# Patient Record
Sex: Female | Born: 1980 | Race: White | Hispanic: No | Marital: Married | Smoking: Never smoker
Health system: Southern US, Community
[De-identification: ages and names within clinical notes are randomized; demographics above are authoritative.]

## PROBLEM LIST (undated history)

## (undated) ENCOUNTER — Emergency Department: Admission: EM | Payer: Managed Care, Other (non HMO)

## (undated) DIAGNOSIS — G43909 Migraine, unspecified, not intractable, without status migrainosus: Secondary | ICD-10-CM

## (undated) DIAGNOSIS — M1990 Unspecified osteoarthritis, unspecified site: Secondary | ICD-10-CM

## (undated) HISTORY — DX: Unspecified osteoarthritis, unspecified site: M19.90

## (undated) HISTORY — DX: Migraine, unspecified, not intractable, without status migrainosus: G43.909

---
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
## 2004-06-05 ENCOUNTER — Ambulatory Visit: Payer: Self-pay | Admitting: Family Medicine

## 2004-08-26 ENCOUNTER — Emergency Department: Payer: Self-pay | Admitting: Emergency Medicine

## 2006-05-01 ENCOUNTER — Ambulatory Visit: Payer: Self-pay | Admitting: Ophthalmology

## 2007-08-26 ENCOUNTER — Ambulatory Visit: Payer: Self-pay | Admitting: Internal Medicine

## 2007-08-26 DIAGNOSIS — J309 Allergic rhinitis, unspecified: Secondary | ICD-10-CM

## 2007-08-26 DIAGNOSIS — R002 Palpitations: Secondary | ICD-10-CM | POA: Insufficient documentation

## 2007-08-28 LAB — CONVERTED CEMR LAB
AST: 23 units/L (ref 0–37)
Albumin: 4.5 g/dL (ref 3.5–5.2)
Alkaline Phosphatase: 59 units/L (ref 39–117)
BUN: 11 mg/dL (ref 6–23)
Hemoglobin: 13.7 g/dL (ref 12.0–15.0)
MCHC: 33.2 g/dL (ref 30.0–36.0)
Potassium: 4 meq/L (ref 3.5–5.3)
RDW: 12.4 % (ref 11.5–15.5)

## 2009-05-17 ENCOUNTER — Ambulatory Visit: Payer: Self-pay | Admitting: General Practice

## 2009-05-27 ENCOUNTER — Ambulatory Visit: Payer: Self-pay | Admitting: Family Medicine

## 2011-01-08 ENCOUNTER — Ambulatory Visit: Payer: Self-pay | Admitting: Neurology

## 2011-05-23 ENCOUNTER — Emergency Department: Payer: Self-pay | Admitting: Emergency Medicine

## 2011-05-23 LAB — URINALYSIS, COMPLETE
Bilirubin,UR: NEGATIVE
Ph: 6 (ref 4.5–8.0)
Squamous Epithelial: <1
WBC UR: NONE SEEN /HPF (ref 0–5)

## 2011-05-23 LAB — COMPREHENSIVE METABOLIC PANEL
Creatinine: 0.69 mg/dL (ref 0.60–1.30)
EGFR (African American): >60
Glucose: 97 mg/dL (ref 65–99)
Osmolality: 279 (ref 275–301)
Potassium: 3.5 mmol/L (ref 3.5–5.1)
SGOT(AST): 26 U/L (ref 15–37)
SGPT (ALT): 25 U/L
Sodium: 140 mmol/L (ref 136–145)
Total Protein: 7.8 g/dL (ref 6.4–8.2)

## 2011-05-23 LAB — CBC
HCT: 37.1 % (ref 35.0–47.0)
HGB: 12.9 g/dL (ref 12.0–16.0)
MCH: 32 pg (ref 26.0–34.0)
Platelet: 259 10*3/uL (ref 150–440)
RBC: 4.02 10*6/uL (ref 3.80–5.20)
RDW: 12.3 % (ref 11.5–14.5)

## 2011-05-23 LAB — WET PREP, GENITAL

## 2011-05-23 LAB — HCG, QUANTITATIVE, PREGNANCY: Beta Hcg, Quant.: <1 m[IU]/mL — ABNORMAL LOW

## 2012-05-25 ENCOUNTER — Emergency Department: Payer: Self-pay | Admitting: Emergency Medicine

## 2012-05-25 LAB — COMPREHENSIVE METABOLIC PANEL
Anion Gap: 7 (ref 7–16)
BUN: 6 mg/dL — ABNORMAL LOW (ref 7–18)
Bilirubin,Total: 0.3 mg/dL (ref 0.2–1.0)
Co2: 23 mmol/L (ref 21–32)
Creatinine: 0.54 mg/dL — ABNORMAL LOW (ref 0.60–1.30)
EGFR (African American): >60
EGFR (Non-African Amer.): >60
Osmolality: 269 (ref 275–301)
SGOT(AST): 19 U/L (ref 15–37)
SGPT (ALT): 18 U/L (ref 12–78)
Sodium: 136 mmol/L (ref 136–145)

## 2012-05-25 LAB — URINALYSIS, COMPLETE
Glucose,UR: NEGATIVE mg/dL (ref 0–75)
Ketone: NEGATIVE
Ph: 6 (ref 4.5–8.0)
Specific Gravity: 1.009 (ref 1.003–1.030)

## 2012-05-25 LAB — CBC
HCT: 33.1 % — ABNORMAL LOW (ref 35.0–47.0)
HGB: 12 g/dL (ref 12.0–16.0)
MCH: 33.2 pg (ref 26.0–34.0)
Platelet: 223 10*3/uL (ref 150–440)
RDW: 13 % (ref 11.5–14.5)
WBC: 9.1 10*3/uL (ref 3.6–11.0)

## 2012-05-25 LAB — HCG, QUANTITATIVE, PREGNANCY: Beta Hcg, Quant.: 4255 m[IU]/mL — ABNORMAL HIGH

## 2012-10-24 ENCOUNTER — Observation Stay: Payer: Self-pay

## 2012-10-30 ENCOUNTER — Observation Stay: Payer: Self-pay | Admitting: Obstetrics and Gynecology

## 2012-11-01 ENCOUNTER — Inpatient Hospital Stay: Payer: Self-pay

## 2012-11-02 LAB — CBC WITH DIFFERENTIAL/PLATELET
Basophil #: 0 10*3/uL (ref 0.0–0.1)
Basophil %: 0.3 %
Eosinophil #: 0 10*3/uL (ref 0.0–0.7)
Lymphocyte %: 24.4 %
MCH: 33.6 pg (ref 26.0–34.0)
Monocyte #: 1.1 x10 3/mm — ABNORMAL HIGH (ref 0.2–0.9)
Neutrophil #: 8.5 10*3/uL — ABNORMAL HIGH (ref 1.4–6.5)
Neutrophil %: 66.6 %

## 2013-04-07 ENCOUNTER — Ambulatory Visit: Payer: Self-pay | Admitting: Family Medicine

## 2013-04-07 LAB — RAPID INFLUENZA A&B ANTIGENS

## 2014-05-31 NOTE — H&P (Signed)
L&D Evaluation:  History:  HPI 34 year old G1 P0 with EDC=10/27/2012 presents to L&D at 39 4/7 weeks for a AFI and NST due to S<D. Was seen in the office yesterday and FH had decreased from 36 to 34 cm. Has an appt 10/7 for an ultrasound for growth and AFI. Baby active.   Presents with size<dates for NST and AFI.   Patient's Medical History migraine   Patient's Surgical History none   Medications Pre Natal Vitamins   Allergies NKDA   Social History none   Family History Non-Contributory   ROS:  ROS see HPI   Exam:  Vital Signs stable   General no apparent distress   Mental Status clear    Abdomen gravid, non-tender   Estimated Fetal Weight 6#9oz per biometrics   Fetal Position cephalic   FHT Reactive NST with baseline 125 and accels to 160-170s   Ucx occ   Other AFI=10.7cm (1.56cm+2.41+4.16+2.57cm)   Impression:  Impression reactive NST, Normal AFI   Plan:  Plan DC home. Continue daily FKCs. FU on 10/7 as scheduled. LAbor precautions.   Electronic Signatures: Karene Fry (CNM)  (Signed 06-Oct-14 06:07)  Authored: L&D Evaluation   Last Updated: 06-Oct-14 06:07 by Karene Fry (CNM)

## 2014-05-31 NOTE — H&P (Signed)
L&D Evaluation:  History:  HPI 34 year old G1 P0 with EDC=10/27/2012 presents to L&D at 40 3/7 weeks.  Sent from clinic where post dates NST revealed decel to 90's lasting 1.83min.  AFI was 8cm.  +FM, no LOF, no VB.  1cm in clinic   Presents with size<dates for NST and AFI.   Patient's Medical History migraine   Patient's Surgical History none   Medications Pre Natal Vitamins   Allergies NKDA   Social History none   Family History Non-Contributory   ROS:  ROS see HPI   Exam:  Vital Signs stable   General no apparent distress   Mental Status clear   Abdomen gravid, non-tender   Fetal Position cephalic   FHT Category I NST   Ucx absent   Impression:  Impression reactive NST   Plan:  Plan EFM/NST   Comments 1) Tracing strictly category I over 2-hrs of monitoring, that in conjuction with normal AFI patient reassured and discharged home with routine labor precautions 2) IOL on 11/01/12   Electronic Signatures: Dorthula Nettles (MD)  (Signed 10-Oct-14 18:24)  Authored: L&D Evaluation   Last Updated: 10-Oct-14 18:24 by Dorthula Nettles (MD)

## 2014-06-28 ENCOUNTER — Telehealth: Payer: Self-pay | Admitting: Obstetrics and Gynecology

## 2014-06-28 NOTE — Telephone Encounter (Signed)
PT CALLED AND NEEDS REFILL ON BC BUT HAS SOME QUESTIONS FIRST. PLEASE CALL!!

## 2014-07-05 NOTE — Telephone Encounter (Signed)
Left message for pt to call.

## 2014-07-19 NOTE — Telephone Encounter (Signed)
Mailed card for pt to contact office

## 2014-12-04 ENCOUNTER — Emergency Department
Admission: EM | Admit: 2014-12-04 | Discharge: 2014-12-04 | Disposition: A | Discharge status: Home / Self Care | Payer: Worker's Compensation | Attending: Emergency Medicine | Admitting: Emergency Medicine

## 2014-12-04 ENCOUNTER — Encounter: Payer: Self-pay | Admitting: Emergency Medicine

## 2014-12-04 ENCOUNTER — Emergency Department: Payer: Worker's Compensation

## 2014-12-04 DIAGNOSIS — S67192A Crushing injury of right middle finger, initial encounter: Secondary | ICD-10-CM | POA: Insufficient documentation

## 2014-12-04 DIAGNOSIS — W231XXA Caught, crushed, jammed, or pinched between stationary objects, initial encounter: Secondary | ICD-10-CM | POA: Diagnosis not present

## 2014-12-04 DIAGNOSIS — Y9389 Activity, other specified: Secondary | ICD-10-CM | POA: Insufficient documentation

## 2014-12-04 DIAGNOSIS — S6721XA Crushing injury of right hand, initial encounter: Secondary | ICD-10-CM | POA: Diagnosis not present

## 2014-12-04 DIAGNOSIS — Y990 Civilian activity done for income or pay: Secondary | ICD-10-CM | POA: Diagnosis not present

## 2014-12-04 DIAGNOSIS — Y9289 Other specified places as the place of occurrence of the external cause: Secondary | ICD-10-CM | POA: Diagnosis not present

## 2014-12-04 DIAGNOSIS — S67190A Crushing injury of right index finger, initial encounter: Secondary | ICD-10-CM | POA: Diagnosis not present

## 2014-12-04 DIAGNOSIS — S6991XA Unspecified injury of right wrist, hand and finger(s), initial encounter: Secondary | ICD-10-CM | POA: Diagnosis present

## 2014-12-04 MED ORDER — OXYCODONE-ACETAMINOPHEN 5-325 MG PO TABS
1.0000 | ORAL_TABLET | Freq: Once | ORAL | Status: AC
  Administered 2014-12-04: 1 via ORAL
  Filled 2014-12-04: qty 1

## 2014-12-04 MED ORDER — OXYCODONE-ACETAMINOPHEN 5-325 MG PO TABS: 1.0000 | ORAL_TABLET | ORAL | Status: DC | PRN

## 2014-12-04 NOTE — Discharge Instructions (Signed)
Cryotherapy Cryotherapy is when you put ice on your injury. Ice helps lessen pain and puffiness (swelling) after an injury. Ice works the best when you start using it in the first 24 to 48 hours after an injury. HOME CARE  Put a dry or damp towel between the ice pack and your skin.  You may press gently on the ice pack.  Leave the ice on for no more than 10 to 20 minutes at a time.  Check your skin after 5 minutes to make sure your skin is okay.  Rest at least 20 minutes between ice pack uses.  Stop using ice when your skin loses feeling (numbness).  Do not use ice on someone who cannot tell you when it hurts. This includes small children and people with memory problems (dementia). GET HELP RIGHT AWAY IF:  You have white spots on your skin.  Your skin turns blue or pale.  Your skin feels waxy or hard.  Your puffiness gets worse. MAKE SURE YOU:   Understand these instructions.  Will watch your condition.  Will get help right away if you are not doing well or get worse.   This information is not intended to replace advice given to you by your health care provider. Make sure you discuss any questions you have with your health care provider.   Document Released: 06/26/2007 Document Revised: 04/01/2011 Document Reviewed: 08/30/2010 Elsevier Interactive Patient Education 2016 Stella with orthopedist if any continued problems with her fingers. Percocet as needed for pain. Ice and elevate to decrease swelling and controlled pain. Return to the emergency room if any severe worsening of her symptoms.

## 2014-12-04 NOTE — ED Notes (Signed)
States she shut her right hand in door at work  Pain is mainly to right 3 finger

## 2014-12-04 NOTE — ED Provider Notes (Signed)
[facility] Emergency Department Provider Note  ____________________________________________  Time seen: Approximately 1:06 PM  I have reviewed the triage vital signs and the nursing notes.   HISTORY  Chief Complaint Hand Pain  HPI Diana Whitaker is a 34 y.o. female is here with complaint of right index and third finger pain. Patient states that her hand was caught in the ambulance door. She denies any cuts or abrasions but states that there is swelling to the distal tips of her fingers. She is not taking any over-the-counter medication for this. This is workman's comp. Currently she rates her pain as 5 out of 10.  Supervisor is in the room with the patient and states that he is not concerned about drug screen at this time while he is filling out some paperwork. Prior to x-ray results supervisor left the room and it was unclear whether or not patient is supposed to have a drug screen at this time.   History reviewed. No pertinent past medical history.  Patient Active Problem List   Diagnosis Date Noted  . ALLERGIC RHINITIS 08/26/2007  . PALPITATIONS 08/26/2007    History reviewed. No pertinent past surgical history.  Current Outpatient Rx  Name  Route  Sig  Dispense  Refill  . oxyCODONE-acetaminophen (PERCOCET) 5-325 MG tablet   Oral   Take 1 tablet by mouth every 4 (four) hours as needed for severe pain.   20 tablet   0     Allergies Review of patient's allergies indicates no known allergies.  No family history on file.  Social History Social History  Substance Use Topics  . Smoking status: Never Smoker   . Smokeless tobacco: None  . Alcohol Use: No    Review of Systems Constitutional: No fever/chills ENT: No trauma Cardiovascular: Denies chest pain. Respiratory: Denies shortness of breath. Gastrointestinal:   No nausea, no vomiting.   Musculoskeletal: Negative for back pain.  Positive right hand pain Skin: Negative for  rash. Neurological: Negative for headaches, focal weakness or numbness.  10-point ROS otherwise negative.  ____________________________________________   PHYSICAL EXAM:  VITAL SIGNS: ED Triage Vitals  Enc Vitals Group     BP 12/04/14 1218 124/68 mmHg     Pulse Rate 12/04/14 1218 75     Resp 12/04/14 1218 16     Temp 12/04/14 1218 97.8 F (36.6 C)     Temp Source 12/04/14 1218 Oral     SpO2 12/04/14 1218 98 %     Weight 12/04/14 1218 140 lb (63.504 kg)     Height 12/04/14 1218 5\' 4"  (1.626 m)     Head Cir --      Peak Flow --      Pain Score 12/04/14 1220 5     Pain Loc --      Pain Edu? --      Excl. in [place]? --     Constitutional: Alert and oriented. Well appearing and in no acute distress. Eyes: Conjunctivae are normal. PERRL. EOMI. Head: Atraumatic. Nose: No congestion/rhinnorhea. Neck: No stridor.   Cardiovascular: Normal rate, regular rhythm. Grossly normal heart sounds.  Good peripheral circulation. Respiratory: Normal respiratory effort.  No retractions. Lungs CTAB. Gastrointestinal: Soft and nontender. No distention. Musculoskeletal: Right hand no gross deformity. There is moderate tenderness with decreased motion right index and middle finger. There is some soft tissue swelling present. Motor sensory function intact. Movements of the right hand is slow and guarded secondary to pain. Neurologic:  Normal speech and  language. No gross focal neurologic deficits are appreciated. No gait instability. Skin:  Skin is warm, dry and intact. No rash noted. No ecchymosis or abrasions were noted. Psychiatric: Mood and affect are normal. Speech and behavior are normal.  ____________________________________________   LABS (all labs ordered are listed, but only abnormal results are displayed)  Labs Reviewed - No data to display  RADIOLOGY  X-ray the right hand per radiologist and reviewed by me shows no evidence of fracture dislocation. I, Johnn Hai, personally  viewed and evaluated these images (plain radiographs) as part of my medical decision making.  ____________________________________________   PROCEDURES  Procedure(s) performed: None  Critical Care performed: No  ____________________________________________   INITIAL IMPRESSION / ASSESSMENT AND PLAN / ED COURSE  Pertinent labs & imaging results that were available during my care of the patient were reviewed by me and considered in my medical decision making (see chart for details).  Percocet was given to the patient while in the emergency room. Right hand was wrapped for protection and finger splint for the third finger. Patient was made aware that there was no fracture but more of a crush type injury. She is ice and elevate her hand to reduce swelling and pain. She is follow-up with orthopedist if any continued problems. ____________________________________________   FINAL CLINICAL IMPRESSION(S) / ED DIAGNOSES  Final diagnoses:  Crushing injury of right hand and finger, initial encounter      Johnn Hai, PA-C 12/04/14 [place], MD 12/04/14 [phone]

## 2014-12-04 NOTE — ED Notes (Addendum)
Pt states right fingers were caught in ambulance door. Pt presents with pain to right index and middle fingers. Pt demonstrates movement in fingers with the exception of tip of middle finger. Injury occurred at work.

## 2015-04-06 ENCOUNTER — Encounter: Payer: Self-pay | Admitting: Physician Assistant

## 2015-04-06 ENCOUNTER — Ambulatory Visit: Payer: Self-pay | Admitting: Physician Assistant

## 2015-04-06 VITALS — BP 110/65 | HR 75 | Temp 97.8°F

## 2015-04-06 DIAGNOSIS — H1031 Unspecified acute conjunctivitis, right eye: Secondary | ICD-10-CM

## 2015-04-06 MED ORDER — TOBRAMYCIN 0.3 % OP SOLN
2.0000 [drp] | Freq: Four times a day (QID) | OPHTHALMIC | Status: AC
Start: 2015-04-06 — End: 2015-04-11

## 2015-04-06 NOTE — Progress Notes (Signed)
S: c/o r eye being red and irritated, son has pink eye, eye was not matted this am, used one of her sons drops this am  O: vitals wnl, nad, perrl eomi conjunctive red, throat wnl, neck supple no lymph, lungs c t a, cv rrr  A: acute conjunctivitis  P: tobramycin opth gtts, no work tomorrow if eye is still red/draining

## 2015-05-19 ENCOUNTER — Other Ambulatory Visit: Payer: Self-pay | Admitting: *Deleted

## 2015-05-19 ENCOUNTER — Telehealth: Payer: Self-pay | Admitting: *Deleted

## 2015-05-19 MED ORDER — ACYCLOVIR 400 MG PO TABS: 400.0000 mg | ORAL_TABLET | Freq: Two times a day (BID) | ORAL | Status: DC

## 2015-05-19 NOTE — Telephone Encounter (Signed)
Done-ac 

## 2015-05-19 NOTE — Telephone Encounter (Signed)
Patient called na states she needs a refill of her Acyclovir. Patient states her RX expired and she is coming to see Melody on 07/20/15 for a annual . Patient was wondering if she can get a Rx before she see her. Her pharmacy is [facility] in [place]. Thanks.

## 2015-06-16 ENCOUNTER — Encounter: Payer: Self-pay | Admitting: Physician Assistant

## 2015-06-16 ENCOUNTER — Encounter (INDEPENDENT_AMBULATORY_CARE_PROVIDER_SITE_OTHER): Payer: Self-pay

## 2015-06-16 ENCOUNTER — Ambulatory Visit: Payer: Self-pay | Admitting: Physician Assistant

## 2015-06-16 VITALS — BP 100/60 | HR 69 | Temp 98.3°F | Ht 64.0 in | Wt 150.0 lb

## 2015-06-16 DIAGNOSIS — Z0000 Encounter for general adult medical examination without abnormal findings: Secondary | ICD-10-CM

## 2015-06-16 MED ORDER — RIZATRIPTAN BENZOATE 10 MG PO TABS: 10.0000 mg | ORAL_TABLET | ORAL | Status: DC | PRN

## 2015-06-16 NOTE — Progress Notes (Signed)
   Subjective:Physical Exam    Patient ID: Diana Whitaker, female    DOB: [birth year], 35 y.o.   MRN: [id number]  HPI Patient for annual physical exam. Medication refill for Maxalt   Review of Systems Seasonal Rhinitis Migraine Headaches    Objective:   Physical Exam No acute distress. Vital signs WNL. HEENT for bilateral edematous nasal turbinates with clear rhinorrhea. Neck supple, without adenopathy. Lungs CTA, and Heart RRR. Abdomen w/o HSM, normoactive BS, SNTTP. No spinal deformity, F/E ROM. No deformity of upper/lower extremities. F/E ROM. Strength 5/5. CNII-XII grossly intact. Labs pending.       Assessment & Plan:Well exam   Refill Maxalt . Follow up with PCP/GYN  For continual care.

## 2015-06-16 NOTE — Addendum Note (Signed)
Addended by: Rudene Anda T on: 06/16/2015 10:16 AM   Modules accepted: Orders

## 2015-06-17 LAB — CMP12+LP+TP+TSH+6AC+CBC/D/PLT
ALBUMIN: 4.1 g/dL (ref 3.5–5.5)
ALK PHOS: 68 IU/L (ref 39–117)
ALT: 13 IU/L (ref 0–32)
AST: 12 IU/L (ref 0–40)
Albumin/Globulin Ratio: 1.5 (ref 1.2–2.2)
BASOS ABS: 0 10*3/uL (ref 0.0–0.2)
BASOS: 0 %
BUN / CREAT RATIO: 13 (ref 9–23)
BUN: 11 mg/dL (ref 6–20)
Bilirubin Total: 0.3 mg/dL (ref 0.0–1.2)
CALCIUM: 8.7 mg/dL (ref 8.7–10.2)
CHOL/HDL RATIO: 3.3 ratio (ref 0.0–4.4)
CREATININE: 0.84 mg/dL (ref 0.57–1.00)
Chloride: 103 mmol/L (ref 96–106)
Cholesterol, Total: 110 mg/dL (ref 100–199)
EOS (ABSOLUTE): 0.2 10*3/uL (ref 0.0–0.4)
EOS: 2 %
Free Thyroxine Index: 2.2 (ref 1.2–4.9)
GFR, EST AFRICAN AMERICAN: 103 mL/min/{1.73_m2} (ref >59)
GFR, EST NON AFRICAN AMERICAN: 89 mL/min/{1.73_m2} (ref >59)
GGT: 12 IU/L (ref 0–60)
GLOBULIN, TOTAL: 2.7 g/dL (ref 1.5–4.5)
Glucose: 85 mg/dL (ref 65–99)
HDL: 33 mg/dL — AB (ref >39)
HEMATOCRIT: 40 % (ref 34.0–46.6)
HEMOGLOBIN: 13.4 g/dL (ref 11.1–15.9)
IMMATURE GRANULOCYTES: 0 %
IRON: 113 ug/dL (ref 27–159)
Immature Grans (Abs): 0 10*3/uL (ref 0.0–0.1)
LDH: 118 IU/L — AB (ref 119–226)
LDL CALC: 62 mg/dL (ref 0–99)
LYMPHS ABS: 2.6 10*3/uL (ref 0.7–3.1)
Lymphs: 35 %
MCH: 30.9 pg (ref 26.6–33.0)
MCHC: 33.5 g/dL (ref 31.5–35.7)
MCV: 92 fL (ref 79–97)
MONOCYTES: 8 %
Monocytes Absolute: 0.6 10*3/uL (ref 0.1–0.9)
Neutrophils Absolute: 4.1 10*3/uL (ref 1.4–7.0)
Neutrophils: 55 %
PHOSPHORUS: 3.7 mg/dL (ref 2.5–4.5)
PLATELETS: 333 10*3/uL (ref 150–379)
Potassium: 4.2 mmol/L (ref 3.5–5.2)
RBC: 4.33 x10E6/uL (ref 3.77–5.28)
RDW: 12.5 % (ref 12.3–15.4)
SODIUM: 140 mmol/L (ref 134–144)
T3 UPTAKE RATIO: 22 % — AB (ref 24–39)
T4, Total: 9.9 ug/dL (ref 4.5–12.0)
TSH: 1.87 u[IU]/mL (ref 0.450–4.500)
Total Protein: 6.8 g/dL (ref 6.0–8.5)
Triglycerides: 76 mg/dL (ref 0–149)
URIC ACID: 5.4 mg/dL (ref 2.5–7.1)
VLDL CHOLESTEROL CAL: 15 mg/dL (ref 5–40)
WBC: 7.5 10*3/uL (ref 3.4–10.8)

## 2015-06-17 LAB — VITAMIN D 25 HYDROXY (VIT D DEFICIENCY, FRACTURES): VIT D 25 HYDROXY: 41.6 ng/mL (ref 30.0–100.0)

## 2015-07-17 ENCOUNTER — Other Ambulatory Visit: Payer: Self-pay | Admitting: *Deleted

## 2015-07-17 ENCOUNTER — Telehealth: Payer: Self-pay | Admitting: *Deleted

## 2015-07-17 MED ORDER — RIZATRIPTAN BENZOATE 10 MG PO TABS: 10.0000 mg | ORAL_TABLET | ORAL | Status: DC | PRN

## 2015-07-17 NOTE — Telephone Encounter (Signed)
Patient called and stated she needed to R/S her appt with Melody on 07/20/15 to 08/30/15. Patient needs a refill of her Maxalt to last her until her next appt which is  08/30/15. Her pharmacy [facility] in [place] .  Thanks

## 2015-07-17 NOTE — Telephone Encounter (Signed)
Done-ac 

## 2015-07-20 ENCOUNTER — Encounter: Payer: Self-pay | Admitting: Obstetrics and Gynecology

## 2015-08-30 ENCOUNTER — Encounter: Payer: Self-pay | Admitting: Obstetrics and Gynecology

## 2015-08-30 ENCOUNTER — Ambulatory Visit (INDEPENDENT_AMBULATORY_CARE_PROVIDER_SITE_OTHER): Payer: Managed Care, Other (non HMO) | Admitting: Obstetrics and Gynecology

## 2015-08-30 ENCOUNTER — Other Ambulatory Visit: Payer: Self-pay | Admitting: Obstetrics and Gynecology

## 2015-08-30 VITALS — BP 106/70 | HR 88 | Ht 64.0 in | Wt 152.9 lb

## 2015-08-30 DIAGNOSIS — Z01419 Encounter for gynecological examination (general) (routine) without abnormal findings: Secondary | ICD-10-CM | POA: Diagnosis not present

## 2015-08-30 DIAGNOSIS — E663 Overweight: Secondary | ICD-10-CM | POA: Insufficient documentation

## 2015-08-30 DIAGNOSIS — G43839 Menstrual migraine, intractable, without status migrainosus: Secondary | ICD-10-CM | POA: Insufficient documentation

## 2015-08-30 DIAGNOSIS — K5900 Constipation, unspecified: Secondary | ICD-10-CM | POA: Diagnosis not present

## 2015-08-30 DIAGNOSIS — K5909 Other constipation: Secondary | ICD-10-CM

## 2015-08-30 DIAGNOSIS — N943 Premenstrual tension syndrome: Secondary | ICD-10-CM

## 2015-08-30 DIAGNOSIS — G43909 Migraine, unspecified, not intractable, without status migrainosus: Secondary | ICD-10-CM | POA: Insufficient documentation

## 2015-08-30 MED ORDER — LINACLOTIDE 145 MCG PO CAPS: 145.0000 ug | ORAL_CAPSULE | Freq: Every day | ORAL | 6 refills | Status: DC

## 2015-08-30 MED ORDER — LEVONORGEST-ETH ESTRAD 91-DAY 0.15-0.03 &0.01 MG PO TABS: 1.0000 | ORAL_TABLET | Freq: Every day | ORAL | 4 refills | Status: DC

## 2015-08-30 MED ORDER — RIZATRIPTAN BENZOATE 10 MG PO TABS: 10.0000 mg | ORAL_TABLET | ORAL | 2 refills | Status: DC | PRN

## 2015-08-30 NOTE — Patient Instructions (Signed)
Preventive Care for Adults, Female A healthy lifestyle and preventive care can promote health and wellness. Preventive health guidelines for women include the following key practices.  A routine yearly physical is a good way to check with your health care provider about your health and preventive screening. It is a chance to share any concerns and updates on your health and to receive a thorough exam.  Visit your dentist for a routine exam and preventive care every 6 months. Brush your teeth twice a day and floss once a day. Good oral hygiene prevents tooth decay and gum disease.  The frequency of eye exams is based on your age, health, family medical history, use of contact lenses, and other factors. Follow your health care provider's recommendations for frequency of eye exams.  Eat a healthy diet. Foods like vegetables, fruits, whole grains, low-fat dairy products, and lean protein foods contain the nutrients you need without too many calories. Decrease your intake of foods high in solid fats, added sugars, and salt. Eat the right amount of calories for you.Get information about a proper diet from your health care provider, if necessary.  Regular physical exercise is one of the most important things you can do for your health. Most adults should get at least 150 minutes of moderate-intensity exercise (any activity that increases your heart rate and causes you to sweat) each week. In addition, most adults need muscle-strengthening exercises on 2 or more days a week.  Maintain a healthy weight. The body mass index (BMI) is a screening tool to identify possible weight problems. It provides an estimate of body fat based on height and weight. Your health care provider can find your BMI and can help you achieve or maintain a healthy weight.For adults 20 years and older:  A BMI below 18.5 is considered underweight.  A BMI of 18.5 to 24.9 is normal.  A BMI of 25 to 29.9 is considered  overweight.  A BMI of 30 and above is considered obese.  Maintain normal blood lipids and cholesterol levels by exercising and minimizing your intake of saturated fat. Eat a balanced diet with plenty of fruit and vegetables. Blood tests for lipids and cholesterol should begin at age 64 and be repeated every 5 years. If your lipid or cholesterol levels are high, you are over 50, or you are at high risk for heart disease, you may need your cholesterol levels checked more frequently.Ongoing high lipid and cholesterol levels should be treated with medicines if diet and exercise are not working.  If you smoke, find out from your health care provider how to quit. If you do not use tobacco, do not start.  Lung cancer screening is recommended for adults aged 52-80 years who are at high risk for developing lung cancer because of a history of smoking. A yearly low-dose CT scan of the lungs is recommended for people who have at least a 30-pack-year history of smoking and are a current smoker or have quit within the past 15 years. A pack year of smoking is smoking an average of 1 pack of cigarettes a day for 1 year (for example: 1 pack a day for 30 years or 2 packs a day for 15 years). Yearly screening should continue until the smoker has stopped smoking for at least 15 years. Yearly screening should be stopped for people who develop a health problem that would prevent them from having lung cancer treatment.  If you are pregnant, do not drink alcohol. If you are  breastfeeding, be very cautious about drinking alcohol. If you are not pregnant and choose to drink alcohol, do not have more than 1 drink per day. One drink is considered to be 12 ounces (355 mL) of beer, 5 ounces (148 mL) of wine, or 1.5 ounces (44 mL) of liquor.  Avoid use of street drugs. Do not share needles with anyone. Ask for help if you need support or instructions about stopping the use of drugs.  High blood pressure causes heart disease and  increases the risk of stroke. Your blood pressure should be checked at least every 1 to 2 years. Ongoing high blood pressure should be treated with medicines if weight loss and exercise do not work.  If you are 25-78 years old, ask your health care provider if you should take aspirin to prevent strokes.  Diabetes screening is done by taking a blood sample to check your blood glucose level after you have not eaten for a certain period of time (fasting). If you are not overweight and you do not have risk factors for diabetes, you should be screened once every 3 years starting at age 86. If you are overweight or obese and you are 3-87 years of age, you should be screened for diabetes every year as part of your cardiovascular risk assessment.  Breast cancer screening is essential preventive care for women. You should practice "breast self-awareness." This means understanding the normal appearance and feel of your breasts and may include breast self-examination. Any changes detected, no matter how small, should be reported to a health care provider. Women in their 66s and 30s should have a clinical breast exam (CBE) by a health care provider as part of a regular health exam every 1 to 3 years. After age 43, women should have a CBE every year. Starting at age 37, women should consider having a mammogram (breast X-ray test) every year. Women who have a family history of breast cancer should talk to their health care provider about genetic screening. Women at a high risk of breast cancer should talk to their health care providers about having an MRI and a mammogram every year.  Breast cancer gene (BRCA)-related cancer risk assessment is recommended for women who have family members with BRCA-related cancers. BRCA-related cancers include breast, ovarian, tubal, and peritoneal cancers. Having family members with these cancers may be associated with an increased risk for harmful changes (mutations) in the breast  cancer genes BRCA1 and BRCA2. Results of the assessment will determine the need for genetic counseling and BRCA1 and BRCA2 testing.  Your health care provider may recommend that you be screened regularly for cancer of the pelvic organs (ovaries, uterus, and vagina). This screening involves a pelvic examination, including checking for microscopic changes to the surface of your cervix (Pap test). You may be encouraged to have this screening done every 3 years, beginning at age 78.  For women ages 79-65, health care providers may recommend pelvic exams and Pap testing every 3 years, or they may recommend the Pap and pelvic exam, combined with testing for human papilloma virus (HPV), every 5 years. Some types of HPV increase your risk of cervical cancer. Testing for HPV may also be done on women of any age with unclear Pap test results.  Other health care providers may not recommend any screening for nonpregnant women who are considered low risk for pelvic cancer and who do not have symptoms. Ask your health care provider if a screening pelvic exam is right for  you.  If you have had past treatment for cervical cancer or a condition that could lead to cancer, you need Pap tests and screening for cancer for at least 20 years after your treatment. If Pap tests have been discontinued, your risk factors (such as having a new sexual partner) need to be reassessed to determine if screening should resume. Some women have medical problems that increase the chance of getting cervical cancer. In these cases, your health care provider may recommend more frequent screening and Pap tests.  Colorectal cancer can be detected and often prevented. Most routine colorectal cancer screening begins at the age of 50 years and continues through age 75 years. However, your health care provider may recommend screening at an earlier age if you have risk factors for colon cancer. On a yearly basis, your health care provider may provide  home test kits to check for hidden blood in the stool. Use of a small camera at the end of a tube, to directly examine the colon (sigmoidoscopy or colonoscopy), can detect the earliest forms of colorectal cancer. Talk to your health care provider about this at age 50, when routine screening begins. Direct exam of the colon should be repeated every 5-10 years through age 75 years, unless early forms of precancerous polyps or small growths are found.  People who are at an increased risk for hepatitis B should be screened for this virus. You are considered at high risk for hepatitis B if:  You were born in a country where hepatitis B occurs often. Talk with your health care provider about which countries are considered high risk.  Your parents were born in a high-risk country and you have not received a shot to protect against hepatitis B (hepatitis B vaccine).  You have HIV or AIDS.  You use needles to inject street drugs.  You live with, or have sex with, someone who has hepatitis B.  You get hemodialysis treatment.  You take certain medicines for conditions like cancer, organ transplantation, and autoimmune conditions.  Hepatitis C blood testing is recommended for all people born from 1945 through 1965 and any individual with known risks for hepatitis C.  Practice safe sex. Use condoms and avoid high-risk sexual practices to reduce the spread of sexually transmitted infections (STIs). STIs include gonorrhea, chlamydia, syphilis, trichomonas, herpes, HPV, and human immunodeficiency virus (HIV). Herpes, HIV, and HPV are viral illnesses that have no cure. They can result in disability, cancer, and death.  You should be screened for sexually transmitted illnesses (STIs) including gonorrhea and chlamydia if:  You are sexually active and are younger than 24 years.  You are older than 24 years and your health care provider tells you that you are at risk for this type of infection.  Your sexual  activity has changed since you were last screened and you are at an increased risk for chlamydia or gonorrhea. Ask your health care provider if you are at risk.  If you are at risk of being infected with HIV, it is recommended that you take a prescription medicine daily to prevent HIV infection. This is called preexposure prophylaxis (PrEP). You are considered at risk if:  You are sexually active and do not regularly use condoms or know the HIV status of your partner(s).  You take drugs by injection.  You are sexually active with a partner who has HIV.  Talk with your health care provider about whether you are at high risk of being infected with HIV. If   you choose to begin PrEP, you should first be tested for HIV. You should then be tested every 3 months for as long as you are taking PrEP.  Osteoporosis is a disease in which the bones lose minerals and strength with aging. This can result in serious bone fractures or breaks. The risk of osteoporosis can be identified using a bone density scan. Women ages 1 years and over and women at risk for fractures or osteoporosis should discuss screening with their health care providers. Ask your health care provider whether you should take a calcium supplement or vitamin D to reduce the rate of osteoporosis.  Menopause can be associated with physical symptoms and risks. Hormone replacement therapy is available to decrease symptoms and risks. You should talk to your health care provider about whether hormone replacement therapy is right for you.  Use sunscreen. Apply sunscreen liberally and repeatedly throughout the day. You should seek shade when your shadow is shorter than you. Protect yourself by wearing long sleeves, pants, a wide-brimmed hat, and sunglasses year round, whenever you are outdoors.  Once a month, do a whole body skin exam, using a mirror to look at the skin on your back. Tell your health care provider of new moles, moles that have irregular  borders, moles that are larger than a pencil eraser, or moles that have changed in shape or color.  Stay current with required vaccines (immunizations).  Influenza vaccine. All adults should be immunized every year.  Tetanus, diphtheria, and acellular pertussis (Td, Tdap) vaccine. Pregnant women should receive 1 dose of Tdap vaccine during each pregnancy. The dose should be obtained regardless of the length of time since the last dose. Immunization is preferred during the 27th-36th week of gestation. An adult who has not previously received Tdap or who does not know her vaccine status should receive 1 dose of Tdap. This initial dose should be followed by tetanus and diphtheria toxoids (Td) booster doses every 10 years. Adults with an unknown or incomplete history of completing a 3-dose immunization series with Td-containing vaccines should begin or complete a primary immunization series including a Tdap dose. Adults should receive a Td booster every 10 years.  Varicella vaccine. An adult without evidence of immunity to varicella should receive 2 doses or a second dose if she has previously received 1 dose. Pregnant females who do not have evidence of immunity should receive the first dose after pregnancy. This first dose should be obtained before leaving the health care facility. The second dose should be obtained 4-8 weeks after the first dose.  Human papillomavirus (HPV) vaccine. Females aged 13-26 years who have not received the vaccine previously should obtain the 3-dose series. The vaccine is not recommended for use in pregnant females. However, pregnancy testing is not needed before receiving a dose. If a female is found to be pregnant after receiving a dose, no treatment is needed. In that case, the remaining doses should be delayed until after the pregnancy. Immunization is recommended for any person with an immunocompromised condition through the age of 24 years if she did not get any or all doses  earlier. During the 3-dose series, the second dose should be obtained 4-8 weeks after the first dose. The third dose should be obtained 24 weeks after the first dose and 16 weeks after the second dose.  Zoster vaccine. One dose is recommended for adults aged 97 years or older unless certain conditions are present.  Measles, mumps, and rubella (MMR) vaccine. Adults born  before 1957 generally are considered immune to measles and mumps. Adults born in [birth year] or later should have 1 or more doses of MMR vaccine unless there is a contraindication to the vaccine or there is laboratory evidence of immunity to each of the three diseases. A routine second dose of MMR vaccine should be obtained at least 28 days after the first dose for students attending postsecondary schools, health care workers, or international travelers. People who received inactivated measles vaccine or an unknown type of measles vaccine during 1963-1967 should receive 2 doses of MMR vaccine. People who received inactivated mumps vaccine or an unknown type of mumps vaccine before 1979 and are at high risk for mumps infection should consider immunization with 2 doses of MMR vaccine. For females of childbearing age, rubella immunity should be determined. If there is no evidence of immunity, females who are not pregnant should be vaccinated. If there is no evidence of immunity, females who are pregnant should delay immunization until after pregnancy. Unvaccinated health care workers born before 60 who lack laboratory evidence of measles, mumps, or rubella immunity or laboratory confirmation of disease should consider measles and mumps immunization with 2 doses of MMR vaccine or rubella immunization with 1 dose of MMR vaccine.  Pneumococcal 13-valent conjugate (PCV13) vaccine. When indicated, a person who is uncertain of his immunization history and has no record of immunization should receive the PCV13 vaccine. All adults 61 years of age and older  should receive this vaccine. An adult aged 92 years or older who has certain medical conditions and has not been previously immunized should receive 1 dose of PCV13 vaccine. This PCV13 should be followed with a dose of pneumococcal polysaccharide (PPSV23) vaccine. Adults who are at high risk for pneumococcal disease should obtain the PPSV23 vaccine at least 8 weeks after the dose of PCV13 vaccine. Adults older than 37 years of age who have normal immune system function should obtain the PPSV23 vaccine dose at least 1 year after the dose of PCV13 vaccine.  Pneumococcal polysaccharide (PPSV23) vaccine. When PCV13 is also indicated, PCV13 should be obtained first. All adults aged 2 years and older should be immunized. An adult younger than age 30 years who has certain medical conditions should be immunized. Any person who resides in a nursing home or long-term care facility should be immunized. An adult smoker should be immunized. People with an immunocompromised condition and certain other conditions should receive both PCV13 and PPSV23 vaccines. People with human immunodeficiency virus (HIV) infection should be immunized as soon as possible after diagnosis. Immunization during chemotherapy or radiation therapy should be avoided. Routine use of PPSV23 vaccine is not recommended for American Indians, [place] Natives, or people younger than 65 years unless there are medical conditions that require PPSV23 vaccine. When indicated, people who have unknown immunization and have no record of immunization should receive PPSV23 vaccine. One-time revaccination 5 years after the first dose of PPSV23 is recommended for people aged 19-64 years who have chronic kidney failure, nephrotic syndrome, asplenia, or immunocompromised conditions. People who received 1-2 doses of PPSV23 before age 44 years should receive another dose of PPSV23 vaccine at age 83 years or later if at least 5 years have passed since the previous dose. Doses  of PPSV23 are not needed for people immunized with PPSV23 at or after age 20 years.  Meningococcal vaccine. Adults with asplenia or persistent complement component deficiencies should receive 2 doses of quadrivalent meningococcal conjugate (MenACWY-D) vaccine. The doses should be obtained  at least 2 months apart. Microbiologists working with certain meningococcal bacteria, [place] recruits, people at risk during an outbreak, and people who travel to or live in countries with a high rate of meningitis should be immunized. A first-year college student up through age 28 years who is living in a residence hall should receive a dose if she did not receive a dose on or after her 16th birthday. Adults who have certain high-risk conditions should receive one or more doses of vaccine.  Hepatitis A vaccine. Adults who wish to be protected from this disease, have certain high-risk conditions, work with hepatitis A-infected animals, work in hepatitis A research labs, or travel to or work in countries with a high rate of hepatitis A should be immunized. Adults who were previously unvaccinated and who anticipate close contact with an international adoptee during the first 60 days after arrival in the [place] from a country with a high rate of hepatitis A should be immunized.  Hepatitis B vaccine. Adults who wish to be protected from this disease, have certain high-risk conditions, may be exposed to blood or other infectious body fluids, are household contacts or sex partners of hepatitis B positive people, are clients or workers in certain care facilities, or travel to or work in countries with a high rate of hepatitis B should be immunized.  Haemophilus influenzae type b (Hib) vaccine. A previously unvaccinated person with asplenia or sickle cell disease or having a scheduled splenectomy should receive 1 dose of Hib vaccine. Regardless of previous immunization, a recipient of a hematopoietic stem cell transplant  should receive a 3-dose series 6-12 months after her successful transplant. Hib vaccine is not recommended for adults with HIV infection. Preventive Services / Frequency Ages 71 to 87 years  Blood pressure check.** / Every 3-5 years.  Lipid and cholesterol check.** / Every 5 years beginning at age 1.  Clinical breast exam.** / Every 3 years for women in their 3s and 31s.  BRCA-related cancer risk assessment.** / For women who have family members with a BRCA-related cancer (breast, ovarian, tubal, or peritoneal cancers).  Pap test.** / Every 2 years from ages 50 through 86. Every 3 years starting at age 87 through age 7 or 75 with a history of 3 consecutive normal Pap tests.  HPV screening.** / Every 3 years from ages 59 through ages 35 to 6 with a history of 3 consecutive normal Pap tests.  Hepatitis C blood test.** / For any individual with known risks for hepatitis C.  Skin self-exam. / Monthly.  Influenza vaccine. / Every year.  Tetanus, diphtheria, and acellular pertussis (Tdap, Td) vaccine.** / Consult your health care provider. Pregnant women should receive 1 dose of Tdap vaccine during each pregnancy. 1 dose of Td every 10 years.  Varicella vaccine.** / Consult your health care provider. Pregnant females who do not have evidence of immunity should receive the first dose after pregnancy.  HPV vaccine. / 3 doses over 6 months, if 72 and younger. The vaccine is not recommended for use in pregnant females. However, pregnancy testing is not needed before receiving a dose.  Measles, mumps, rubella (MMR) vaccine.** / You need at least 1 dose of MMR if you were born in 1957 or later. You may also need a 2nd dose. For females of childbearing age, rubella immunity should be determined. If there is no evidence of immunity, females who are not pregnant should be vaccinated. If there is no evidence of immunity, females who are  pregnant should delay immunization until after  pregnancy.  Pneumococcal 13-valent conjugate (PCV13) vaccine.** / Consult your health care provider.  Pneumococcal polysaccharide (PPSV23) vaccine.** / 1 to 2 doses if you smoke cigarettes or if you have certain conditions.  Meningococcal vaccine.** / 1 dose if you are age 87 to 44 years and a Market researcher living in a residence hall, or have one of several medical conditions, you need to get vaccinated against meningococcal disease. You may also need additional booster doses.  Hepatitis A vaccine.** / Consult your health care provider.  Hepatitis B vaccine.** / Consult your health care provider.  Haemophilus influenzae type b (Hib) vaccine.** / Consult your health care provider. Ages 86 to 38 years  Blood pressure check.** / Every year.  Lipid and cholesterol check.** / Every 5 years beginning at age 49 years.  Lung cancer screening. / Every year if you are aged 71-80 years and have a 30-pack-year history of smoking and currently smoke or have quit within the past 15 years. Yearly screening is stopped once you have quit smoking for at least 15 years or develop a health problem that would prevent you from having lung cancer treatment.  Clinical breast exam.** / Every year after age 51 years.  BRCA-related cancer risk assessment.** / For women who have family members with a BRCA-related cancer (breast, ovarian, tubal, or peritoneal cancers).  Mammogram.** / Every year beginning at age 18 years and continuing for as long as you are in good health. Consult with your health care provider.  Pap test.** / Every 3 years starting at age 63 years through age 37 or 57 years with a history of 3 consecutive normal Pap tests.  HPV screening.** / Every 3 years from ages 41 years through ages 76 to 23 years with a history of 3 consecutive normal Pap tests.  Fecal occult blood test (FOBT) of stool. / Every year beginning at age 36 years and continuing until age 51 years. You may not need  to do this test if you get a colonoscopy every 10 years.  Flexible sigmoidoscopy or colonoscopy.** / Every 5 years for a flexible sigmoidoscopy or every 10 years for a colonoscopy beginning at age 36 years and continuing until age 35 years.  Hepatitis C blood test.** / For all people born from [birth year] through 1967 and any individual with known risks for hepatitis C.  Skin self-exam. / Monthly.  Influenza vaccine. / Every year.  Tetanus, diphtheria, and acellular pertussis (Tdap/Td) vaccine.** / Consult your health care provider. Pregnant women should receive 1 dose of Tdap vaccine during each pregnancy. 1 dose of Td every 10 years.  Varicella vaccine.** / Consult your health care provider. Pregnant females who do not have evidence of immunity should receive the first dose after pregnancy.  Zoster vaccine.** / 1 dose for adults aged 73 years or older.  Measles, mumps, rubella (MMR) vaccine.** / You need at least 1 dose of MMR if you were born in 1957 or later. You may also need a second dose. For females of childbearing age, rubella immunity should be determined. If there is no evidence of immunity, females who are not pregnant should be vaccinated. If there is no evidence of immunity, females who are pregnant should delay immunization until after pregnancy.  Pneumococcal 13-valent conjugate (PCV13) vaccine.** / Consult your health care provider.  Pneumococcal polysaccharide (PPSV23) vaccine.** / 1 to 2 doses if you smoke cigarettes or if you have certain conditions.  Meningococcal vaccine.** /  Consult your health care provider.  Hepatitis A vaccine.** / Consult your health care provider.  Hepatitis B vaccine.** / Consult your health care provider.  Haemophilus influenzae type b (Hib) vaccine.** / Consult your health care provider. Ages 80 years and over  Blood pressure check.** / Every year.  Lipid and cholesterol check.** / Every 5 years beginning at age 62 years.  Lung cancer  screening. / Every year if you are aged 32-80 years and have a 30-pack-year history of smoking and currently smoke or have quit within the past 15 years. Yearly screening is stopped once you have quit smoking for at least 15 years or develop a health problem that would prevent you from having lung cancer treatment.  Clinical breast exam.** / Every year after age 61 years.  BRCA-related cancer risk assessment.** / For women who have family members with a BRCA-related cancer (breast, ovarian, tubal, or peritoneal cancers).  Mammogram.** / Every year beginning at age 39 years and continuing for as long as you are in good health. Consult with your health care provider.  Pap test.** / Every 3 years starting at age 85 years through age 74 or 72 years with 3 consecutive normal Pap tests. Testing can be stopped between 65 and 70 years with 3 consecutive normal Pap tests and no abnormal Pap or HPV tests in the past 10 years.  HPV screening.** / Every 3 years from ages 55 years through ages 67 or 77 years with a history of 3 consecutive normal Pap tests. Testing can be stopped between 65 and 70 years with 3 consecutive normal Pap tests and no abnormal Pap or HPV tests in the past 10 years.  Fecal occult blood test (FOBT) of stool. / Every year beginning at age 81 years and continuing until age 22 years. You may not need to do this test if you get a colonoscopy every 10 years.  Flexible sigmoidoscopy or colonoscopy.** / Every 5 years for a flexible sigmoidoscopy or every 10 years for a colonoscopy beginning at age 67 years and continuing until age 22 years.  Hepatitis C blood test.** / For all people born from [birth year] through 1967 and any individual with known risks for hepatitis C.  Osteoporosis screening.** / A one-time screening for women ages 8 years and over and women at risk for fractures or osteoporosis.  Skin self-exam. / Monthly.  Influenza vaccine. / Every year.  Tetanus, diphtheria, and  acellular pertussis (Tdap/Td) vaccine.** / 1 dose of Td every 10 years.  Varicella vaccine.** / Consult your health care provider.  Zoster vaccine.** / 1 dose for adults aged 56 years or older.  Pneumococcal 13-valent conjugate (PCV13) vaccine.** / Consult your health care provider.  Pneumococcal polysaccharide (PPSV23) vaccine.** / 1 dose for all adults aged 15 years and older.  Meningococcal vaccine.** / Consult your health care provider.  Hepatitis A vaccine.** / Consult your health care provider.  Hepatitis B vaccine.** / Consult your health care provider.  Haemophilus influenzae type b (Hib) vaccine.** / Consult your health care provider. ** Family history and personal history of risk and conditions may change your health care provider's recommendations.   This information is not intended to replace advice given to you by your health care provider. Make sure you discuss any questions you have with your health care provider.   Document Released: 03/05/2001 Document Revised: 01/28/2014 Document Reviewed: 06/04/2010 Elsevier Interactive Patient Education [employer].

## 2015-08-30 NOTE — Progress Notes (Signed)
Subjective:   Diana Whitaker is a 35 y.o. G72P0 Caucasian female here for a routine well-woman exam.  Patient's last menstrual period was 08/23/2015 (exact date).    Current complaints: abdominal pain after eating without nausea or vomiting- feels very bloating, always dealt with constipation- typically goes 4-5 days between BMs. PCP: me       doesn't desire labs  Social History: Sexual: heterosexual Marital Status: married Living situation: with family Occupation: EMS with [employer] Tobacco/alcohol: no tobacco use Illicit drugs: no history of illicit drug use  The following portions of the patient's history were reviewed and updated as appropriate: allergies, current medications, past family history, past medical history, past social history, past surgical history and problem list.  Past Medical History Past Medical History:  Diagnosis Date  . Migraines     Past Surgical History History reviewed. No pertinent surgical history.  Gynecologic History G1P0  Patient's last menstrual period was 08/23/2015 (exact date). Contraception: OCP (estrogen/progesterone) Last Pap: 2015. Results were: normal, with h/o of LEEP    Obstetric History OB History  Gravida Para Term Preterm AB Living  1            SAB TAB Ectopic Multiple Live Births               # Outcome Date GA Lbr Len/2nd Weight Sex Delivery Anes PTL Lv  1 Gravida 2014    M Vag-Spont         Current Medications Current Outpatient Prescriptions on File Prior to Visit  Medication Sig Dispense Refill  . acyclovir (ZOVIRAX) 400 MG tablet Take 1 tablet (400 mg total) by mouth 2 (two) times daily. 60 tablet 3  . Levonorgestrel-Ethinyl Estradiol (AMETHIA,CAMRESE) 0.15-0.03 &0.01 MG tablet Take 1 tablet by mouth daily.  4  . rizatriptan (MAXALT) 10 MG tablet Take 1 tablet (10 mg total) by mouth as needed for migraine. May repeat in 2 hours if needed 10 tablet 0  . rizatriptan (MAXALT) 10 MG tablet Take 1 tablet (10 mg  total) by mouth as needed for migraine. May repeat in 2 hours if needed (Patient not taking: Reported on 08/30/2015) 10 tablet 3   No current facility-administered medications on file prior to visit.     Review of Systems Patient denies any headaches, blurred vision, shortness of breath, chest pain, abdominal pain, problems with bowel movements, urination, or intercourse.  Objective:  BP 106/70   Pulse 88   Ht 5\' 4"  (1.626 m)   Wt 152 lb 14.4 oz (69.4 kg)   LMP 08/23/2015 (Exact Date)   BMI 26.25 kg/m  Physical Exam  General:  Well developed, well nourished, no acute distress. She is alert and oriented x3. Skin:  Warm and dry Neck:  Midline trachea, no thyromegaly or nodules Cardiovascular: Regular rate and rhythm, no murmur heard Lungs:  Effort normal, all lung fields clear to auscultation bilaterally Breasts:  No dominant palpable mass, retraction, or nipple discharge Abdomen:  Soft, non tender, no hepatosplenomegaly or masses Pelvic:  External genitalia is normal in appearance.  The vagina is normal in appearance. The cervix is bulbous, no CMT.  Thin prep pap is done with HR HPV cotesting. Uterus is felt to be normal size, shape, and contour.  No adnexal masses or tenderness noted. Rectal: Good sphincter tone, no polyps, or hemorrhoids felt.   Extremities:  No swelling or varicosities noted Psych:  She has a normal mood and affect  Assessment:   Healthy well-woman exam overweight  Chronic constipation Migraines OCP user  Plan:  Pap obtained, will try linzess, refills sent in; F/U 1 year AE for AE, or sooner if needed Mammogram ordered baseline  Emme Rosenau Rockney Ghee, CNM

## 2015-08-31 LAB — CYTOLOGY - PAP

## 2015-09-02 ENCOUNTER — Emergency Department: Payer: Worker's Compensation

## 2015-09-02 ENCOUNTER — Encounter: Payer: Self-pay | Admitting: Emergency Medicine

## 2015-09-02 ENCOUNTER — Emergency Department
Admission: EM | Admit: 2015-09-02 | Discharge: 2015-09-02 | Disposition: A | Discharge status: Home / Self Care | Payer: Worker's Compensation | Attending: Emergency Medicine | Admitting: Emergency Medicine

## 2015-09-02 DIAGNOSIS — M79601 Pain in right arm: Secondary | ICD-10-CM | POA: Diagnosis present

## 2015-09-02 DIAGNOSIS — Y939 Activity, unspecified: Secondary | ICD-10-CM | POA: Diagnosis not present

## 2015-09-02 DIAGNOSIS — T148XXA Other injury of unspecified body region, initial encounter: Secondary | ICD-10-CM

## 2015-09-02 DIAGNOSIS — Y999 Unspecified external cause status: Secondary | ICD-10-CM | POA: Diagnosis not present

## 2015-09-02 DIAGNOSIS — T148 Other injury of unspecified body region: Secondary | ICD-10-CM | POA: Insufficient documentation

## 2015-09-02 DIAGNOSIS — Z79899 Other long term (current) drug therapy: Secondary | ICD-10-CM | POA: Insufficient documentation

## 2015-09-02 DIAGNOSIS — R51 Headache: Secondary | ICD-10-CM | POA: Diagnosis not present

## 2015-09-02 DIAGNOSIS — S40811A Abrasion of right upper arm, initial encounter: Secondary | ICD-10-CM | POA: Insufficient documentation

## 2015-09-02 DIAGNOSIS — Y92410 Unspecified street and highway as the place of occurrence of the external cause: Secondary | ICD-10-CM | POA: Insufficient documentation

## 2015-09-02 MED ORDER — DIAZEPAM 5 MG PO TABS: 5.0000 mg | ORAL_TABLET | Freq: Three times a day (TID) | ORAL | 0 refills | Status: DC | PRN

## 2015-09-02 MED ORDER — IBUPROFEN 600 MG PO TABS: 600.0000 mg | ORAL_TABLET | Freq: Three times a day (TID) | ORAL | 0 refills | Status: DC | PRN

## 2015-09-02 MED ORDER — ACETAMINOPHEN 500 MG PO TABS
1000.0000 mg | ORAL_TABLET | Freq: Once | ORAL | Status: AC
  Administered 2015-09-02: 1000 mg via ORAL
  Filled 2015-09-02: qty 2

## 2015-09-02 MED ORDER — IBUPROFEN 600 MG PO TABS
600.0000 mg | ORAL_TABLET | Freq: Once | ORAL | Status: AC
  Administered 2015-09-02: 600 mg via ORAL
  Filled 2015-09-02: qty 1

## 2015-09-02 MED ORDER — OXYCODONE-ACETAMINOPHEN 5-325 MG PO TABS
2.0000 | ORAL_TABLET | Freq: Once | ORAL | Status: AC
  Administered 2015-09-02: 2 via ORAL
  Filled 2015-09-02: qty 2

## 2015-09-02 NOTE — ED Notes (Signed)
This RN spoke with Diana Whitaker and Lennette Bihari, pt's supervisors, per both pt does not need any kind of worker's comp drug testing.
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
## 2015-09-02 NOTE — ED Notes (Signed)
Pt returned from radiology at this time. States that ibuprofen did not help with pain.

## 2015-09-02 NOTE — ED Notes (Signed)
Patient to CT via stretcher. Tolerated po meds well.
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
## 2015-09-11 ENCOUNTER — Ambulatory Visit
Admission: RE | Admit: 2015-09-11 | Discharge: 2015-09-11 | Disposition: A | Discharge status: Home / Self Care | Payer: Managed Care, Other (non HMO) | Source: Ambulatory Visit | Attending: Obstetrics and Gynecology | Admitting: Obstetrics and Gynecology

## 2015-09-11 ENCOUNTER — Other Ambulatory Visit: Payer: Self-pay | Admitting: Obstetrics and Gynecology

## 2015-09-11 DIAGNOSIS — Z01419 Encounter for gynecological examination (general) (routine) without abnormal findings: Secondary | ICD-10-CM

## 2015-09-11 DIAGNOSIS — Z1231 Encounter for screening mammogram for malignant neoplasm of breast: Secondary | ICD-10-CM | POA: Insufficient documentation

## 2016-01-11 ENCOUNTER — Other Ambulatory Visit: Payer: Self-pay | Admitting: Obstetrics and Gynecology

## 2016-02-12 ENCOUNTER — Ambulatory Visit
Admission: EM | Admit: 2016-02-12 | Discharge: 2016-02-12 | Disposition: A | Discharge status: Home / Self Care | Payer: Managed Care, Other (non HMO) | Attending: Family Medicine | Admitting: Family Medicine

## 2016-02-12 ENCOUNTER — Encounter: Payer: Self-pay | Admitting: *Deleted

## 2016-02-12 DIAGNOSIS — G43009 Migraine without aura, not intractable, without status migrainosus: Secondary | ICD-10-CM

## 2016-02-12 MED ORDER — HYDROCODONE-ACETAMINOPHEN 5-325 MG PO TABS: ORAL_TABLET | ORAL | 0 refills | Status: DC

## 2016-02-12 MED ORDER — KETOROLAC TROMETHAMINE 60 MG/2ML IM SOLN
60.0000 mg | Freq: Once | INTRAMUSCULAR | Status: AC
  Administered 2016-02-12: 60 mg via INTRAMUSCULAR

## 2016-02-12 MED ORDER — ONDANSETRON 8 MG PO TBDP
8.0000 mg | ORAL_TABLET | Freq: Once | ORAL | Status: AC
  Administered 2016-02-12: 8 mg via ORAL

## 2016-02-12 MED ORDER — CYCLOBENZAPRINE HCL 5 MG PO TABS: 10.0000 mg | ORAL_TABLET | Freq: Every day | ORAL | 0 refills | Status: DC

## 2016-02-12 NOTE — ED Triage Notes (Signed)
Migraine headache started this am in the back of her neck with electrical shock to behind her eyes. Patient has a history of migraine headaches.

## 2016-02-12 NOTE — ED Provider Notes (Signed)
[facility]    CSN: [id number] Arrival date & time: 02/12/16  1518     History   Chief Complaint Chief Complaint  Patient presents with  . Migraine    HPI Diana Whitaker is a 36 y.o. female.   36 yo female with a c/o migraine headache since 2am this morning, associated with nausea and photophobia. Denies any vomiting, fevers, chills, vision changes, numbness/tingling. She does also complain of left sided neck pain and tender area.    The history is provided by the patient.    Past Medical History:  Diagnosis Date  . Migraines     Patient Active Problem List   Diagnosis Date Noted  . Chronic constipation 08/30/2015  . Migraines 08/30/2015  . Overweight (BMI 25.0-29.9) 08/30/2015  . ALLERGIC RHINITIS 08/26/2007  . PALPITATIONS 08/26/2007    History reviewed. No pertinent surgical history.  OB History    Gravida Para Term Preterm AB Living   1             SAB TAB Ectopic Multiple Live Births                   Home Medications    Prior to Admission medications   Medication Sig Start Date End Date Taking? Authorizing Provider  ibuprofen (ADVIL,MOTRIN) 600 MG tablet Take 1 tablet (600 mg total) by mouth every 8 (eight) hours as needed. 09/02/15  Yes Earleen Newport, MD  Levonorgestrel-Ethinyl Estradiol (AMETHIA,CAMRESE) 0.15-0.03 &0.01 MG tablet Take 1 tablet by mouth daily. 08/30/15  Yes Melody N Shambley, CNM  rizatriptan (MAXALT) 10 MG tablet TAKE 1 TABLET (10 MG TOTAL) BY MOUTH AS NEEDED FOR MIGRAINE. MAY REPEAT IN 2 HOURS IF NEEDED 01/11/16  Yes Melody N Shambley, CNM  acyclovir (ZOVIRAX) 400 MG tablet Take 1 tablet (400 mg total) by mouth 2 (two) times daily. 05/19/15   Melody N Shambley, CNM  cyclobenzaprine (FLEXERIL) 5 MG tablet Take 2 tablets (10 mg total) by mouth at bedtime. 02/12/16   Norval Gable, MD  diazepam (VALIUM) 5 MG tablet Take 1 tablet (5 mg total) by mouth every 8 (eight) hours as needed for muscle spasms. 09/02/15   Earleen Newport, MD  HYDROcodone-acetaminophen (NORCO/VICODIN) 5-325 MG tablet 1-2 tabs po bid prn 02/12/16   Norval Gable, MD  linaclotide [facility]) 145 MCG CAPS capsule Take 1 capsule (145 mcg total) by mouth daily before breakfast. 08/30/15   Melody N Shambley, CNM  rizatriptan (MAXALT) 10 MG tablet Take 1 tablet (10 mg total) by mouth as needed for migraine. May repeat in 2 hours if needed 08/30/15   Melody Rockney Ghee, CNM    Family History Family History  Problem Relation Age of Onset  . Adopted: Yes    Social History Social History  Substance Use Topics  . Smoking status: Never Smoker  . Smokeless tobacco: Never Used  . Alcohol use No     Allergies   Patient has no known allergies.   Review of Systems Review of Systems   Physical Exam Triage Vital Signs ED Triage Vitals  Enc Vitals Group     BP 02/12/16 1539 126/71     Pulse Rate 02/12/16 1539 66     Resp 02/12/16 1539 16     Temp 02/12/16 1539 98.8 F (37.1 C)     Temp Source 02/12/16 1539 Oral     SpO2 02/12/16 1539 100 %     Weight 02/12/16 1541 150 lb (68 kg)  Height 02/12/16 1541 5\' 4"  (1.626 m)     Head Circumference --      Peak Flow --      Pain Score 02/12/16 1545 10     Pain Loc --      Pain Edu? --      Excl. in [place]? --    No data found.   Updated Vital Signs BP 126/71 (BP Location: Left Arm)   Pulse 66   Temp 98.8 F (37.1 C) (Oral)   Resp 16   Ht 5\' 4"  (1.626 m)   Wt 150 lb (68 kg)   LMP 11/12/2015   SpO2 100%   BMI 25.75 kg/m   Visual Acuity Right Eye Distance:   Left Eye Distance:   Bilateral Distance:    Right Eye Near:   Left Eye Near:    Bilateral Near:     Physical Exam  Constitutional: She is oriented to person, place, and time. She appears well-developed and well-nourished. No distress.  HENT:  Head: Normocephalic and atraumatic.  Right Ear: Tympanic membrane, external ear and ear canal normal.  Left Ear: Tympanic membrane, external ear and ear canal normal.  Nose: No  mucosal edema, rhinorrhea, nose lacerations, sinus tenderness, nasal deformity, septal deviation or nasal septal hematoma. No epistaxis.  No foreign bodies. Right sinus exhibits no maxillary sinus tenderness and no frontal sinus tenderness. Left sinus exhibits no maxillary sinus tenderness and no frontal sinus tenderness.  Mouth/Throat: Uvula is midline, oropharynx is clear and moist and mucous membranes are normal. No oropharyngeal exudate.  Eyes: Conjunctivae and EOM are normal. Pupils are equal, round, and reactive to light. Right eye exhibits no discharge. Left eye exhibits no discharge. No scleral icterus.  Neck: Normal range of motion. Neck supple. No thyromegaly present.  Cardiovascular: Normal rate.   Pulmonary/Chest: Effort normal. No respiratory distress.  Musculoskeletal:       Cervical back: She exhibits tenderness (over the left paraspinous muscles) and spasm (over the left paraspinous muscles).  Lymphadenopathy:    She has no cervical adenopathy.  Neurological: She is alert and oriented to person, place, and time. She displays normal reflexes. No cranial nerve deficit or sensory deficit. She exhibits normal muscle tone. Coordination normal.  Skin: She is not diaphoretic.  Nursing note and vitals reviewed.    UC Treatments / Results  Labs (all labs ordered are listed, but only abnormal results are displayed) Labs Reviewed - No data to display  EKG  EKG Interpretation None       Radiology No results found.  Procedures Procedures (including critical care time)  Medications Ordered in UC Medications  ondansetron (ZOFRAN-ODT) disintegrating tablet 8 mg (8 mg Oral Given 02/12/16 1603)  ketorolac (TORADOL) injection 60 mg (60 mg Intramuscular Given 02/12/16 1602)     Initial Impression / Assessment and Plan / UC Course  I have reviewed the triage vital signs and the nursing notes.  Pertinent labs & imaging results that were available during my care of the patient were  reviewed by me and considered in my medical decision making (see chart for details).       Final Clinical Impressions(s) / UC Diagnoses   Final diagnoses:  Migraine without aura and without status migrainosus, not intractable    New Prescriptions Discharge Medication List as of 02/12/2016  4:54 PM    START taking these medications   Details  cyclobenzaprine (FLEXERIL) 5 MG tablet Take 2 tablets (10 mg total) by mouth at bedtime., Starting Mon  02/12/2016, Normal    HYDROcodone-acetaminophen (NORCO/VICODIN) 5-325 MG tablet 1-2 tabs po bid prn, Print       1. diagnosis reviewed with patient 2. Patient given Toradol 60mg  IM x 1 and zofran 8mg  odt with improvement in symptoms 3.  rx as per orders above; reviewed possible side effects, interactions, risks and benefits  4.  Follow-up prn if symptoms worsen or don't improve   Norval Gable, MD 02/12/16 1743

## 2016-04-16 ENCOUNTER — Other Ambulatory Visit: Payer: Self-pay | Admitting: Obstetrics and Gynecology

## 2016-04-16 ENCOUNTER — Encounter: Payer: Self-pay | Admitting: Obstetrics and Gynecology

## 2016-05-17 ENCOUNTER — Other Ambulatory Visit: Payer: Self-pay | Admitting: Physician Assistant

## 2016-05-17 VITALS — BP 100/70 | HR 69 | Ht 64.0 in | Wt 159.0 lb

## 2016-05-17 DIAGNOSIS — Z299 Encounter for prophylactic measures, unspecified: Secondary | ICD-10-CM

## 2016-05-17 NOTE — Progress Notes (Signed)
Patient came in to have her Biometric Screening only and labs.  Patient expressed that she has already had her physical.

## 2016-05-18 LAB — CMP12+LP+TP+TSH+6AC+CBC/D/PLT
A/G RATIO: 1.6 (ref 1.2–2.2)
ALT: 12 IU/L (ref 0–32)
AST: 18 IU/L (ref 0–40)
Albumin: 4.3 g/dL (ref 3.5–5.5)
Alkaline Phosphatase: 71 IU/L (ref 39–117)
BILIRUBIN TOTAL: 0.4 mg/dL (ref 0.0–1.2)
BUN / CREAT RATIO: 12 (ref 9–23)
BUN: 9 mg/dL (ref 6–20)
Basophils Absolute: 0 10*3/uL (ref 0.0–0.2)
Basos: 0 %
CHOL/HDL RATIO: 3.8 ratio (ref 0.0–4.4)
CREATININE: 0.75 mg/dL (ref 0.57–1.00)
Calcium: 9.4 mg/dL (ref 8.7–10.2)
Chloride: 101 mmol/L (ref 96–106)
Cholesterol, Total: 134 mg/dL (ref 100–199)
EOS (ABSOLUTE): 0.1 10*3/uL (ref 0.0–0.4)
EOS: 2 %
Estimated CHD Risk: 0.8 times avg. (ref 0.0–1.0)
Free Thyroxine Index: 1.9 (ref 1.2–4.9)
GFR calc non Af Amer: 101 mL/min/{1.73_m2} (ref >59)
GFR, EST AFRICAN AMERICAN: 117 mL/min/{1.73_m2} (ref >59)
GGT: 15 IU/L (ref 0–60)
GLOBULIN, TOTAL: 2.7 g/dL (ref 1.5–4.5)
GLUCOSE: 83 mg/dL (ref 65–99)
HDL: 35 mg/dL — AB (ref >39)
HEMOGLOBIN: 14.2 g/dL (ref 11.1–15.9)
Hematocrit: 41.6 % (ref 34.0–46.6)
IMMATURE GRANS (ABS): 0 10*3/uL (ref 0.0–0.1)
Immature Granulocytes: 0 %
Iron: 144 ug/dL (ref 27–159)
LDH: 137 IU/L (ref 119–226)
LDL Calculated: 80 mg/dL (ref 0–99)
LYMPHS: 41 %
Lymphocytes Absolute: 3.1 10*3/uL (ref 0.7–3.1)
MCH: 31.4 pg (ref 26.6–33.0)
MCHC: 34.1 g/dL (ref 31.5–35.7)
MCV: 92 fL (ref 79–97)
MONOCYTES: 7 %
Monocytes Absolute: 0.5 10*3/uL (ref 0.1–0.9)
Neutrophils Absolute: 3.8 10*3/uL (ref 1.4–7.0)
Neutrophils: 50 %
PLATELETS: 357 10*3/uL (ref 150–379)
Phosphorus: 3.4 mg/dL (ref 2.5–4.5)
Potassium: 4.2 mmol/L (ref 3.5–5.2)
RBC: 4.52 x10E6/uL (ref 3.77–5.28)
RDW: 12.9 % (ref 12.3–15.4)
Sodium: 139 mmol/L (ref 134–144)
T3 UPTAKE RATIO: 18 % — AB (ref 24–39)
T4 TOTAL: 10.6 ug/dL (ref 4.5–12.0)
TRIGLYCERIDES: 94 mg/dL (ref 0–149)
TSH: 1.4 u[IU]/mL (ref 0.450–4.500)
Total Protein: 7 g/dL (ref 6.0–8.5)
Uric Acid: 5.5 mg/dL (ref 2.5–7.1)
VLDL CHOLESTEROL CAL: 19 mg/dL (ref 5–40)
WBC: 7.6 10*3/uL (ref 3.4–10.8)

## 2016-05-18 LAB — VITAMIN D 25 HYDROXY (VIT D DEFICIENCY, FRACTURES): Vit D, 25-Hydroxy: 31.7 ng/mL (ref 30.0–100.0)

## 2016-06-25 ENCOUNTER — Ambulatory Visit: Payer: Self-pay | Admitting: Family

## 2016-06-25 VITALS — BP 127/71 | HR 79 | Temp 98.5°F

## 2016-06-25 DIAGNOSIS — G43809 Other migraine, not intractable, without status migrainosus: Secondary | ICD-10-CM

## 2016-06-25 MED ORDER — ONDANSETRON 8 MG PO TBDP: 8.0000 mg | ORAL_TABLET | Freq: Three times a day (TID) | ORAL | 0 refills | Status: DC | PRN

## 2016-06-25 MED ORDER — KETOROLAC TROMETHAMINE 60 MG/2ML IM SOLN
60.0000 mg | Freq: Once | INTRAMUSCULAR | Status: AC
  Administered 2016-06-25: 60 mg via INTRAMUSCULAR

## 2016-06-25 MED ORDER — ZOLMITRIPTAN 5 MG PO TABS: 5.0000 mg | ORAL_TABLET | ORAL | 0 refills | Status: DC | PRN

## 2016-06-26 MED ORDER — BUTALBITAL-ASPIRIN-CAFFEINE 50-325-40 MG PO CAPS: 2.0000 | ORAL_CAPSULE | Freq: Four times a day (QID) | ORAL | 0 refills | Status: DC | PRN

## 2016-06-26 NOTE — Progress Notes (Signed)
S/ Diana Whitaker is here today with a headache that she cannot relieve with her prescription medications. She gets headaches once or twice a year. She is followed by her GYN for birth control pills, trying to regulate and decrease her headaches. This headache is one of her usual ones it is on the left side of her head and she has light sensitivity and nausea, no vomiting or neurological symptoms. She admits to stress as a component.  Vital signs are stable she is alert and oriented without obvious neurological symptoms neck: Supple, cervical muscles are extremely tight and tender heart regular sinus rhythm no murmurs or gallops lungs are clear  A/migraine headache P/Toredol 60 mg IM given in clinic. Rx  for Zomig 5 mg as directed, Zofran, and butalbital .she is advised of instructions and precautions with medications, to go home and rest hydrate and to follow-up with her PCP if symptoms are not improving. We will be happy to refer her to a headache specialist if she desires.

## 2016-09-03 ENCOUNTER — Encounter: Payer: Self-pay | Admitting: Obstetrics and Gynecology

## 2016-09-17 ENCOUNTER — Ambulatory Visit (INDEPENDENT_AMBULATORY_CARE_PROVIDER_SITE_OTHER): Payer: Managed Care, Other (non HMO) | Admitting: Obstetrics and Gynecology

## 2016-09-17 ENCOUNTER — Encounter: Payer: Self-pay | Admitting: Obstetrics and Gynecology

## 2016-09-17 VITALS — BP 129/69 | HR 67 | Ht 64.0 in | Wt 161.2 lb

## 2016-09-17 DIAGNOSIS — Z01411 Encounter for gynecological examination (general) (routine) with abnormal findings: Secondary | ICD-10-CM | POA: Diagnosis not present

## 2016-09-17 MED ORDER — IBUPROFEN 800 MG PO TABS: 800.0000 mg | ORAL_TABLET | Freq: Three times a day (TID) | ORAL | 3 refills | Status: DC | PRN

## 2016-09-17 MED ORDER — ONDANSETRON 8 MG PO TBDP: 8.0000 mg | ORAL_TABLET | Freq: Three times a day (TID) | ORAL | 0 refills | Status: DC | PRN

## 2016-09-17 MED ORDER — ACYCLOVIR 400 MG PO TABS: 400.0000 mg | ORAL_TABLET | Freq: Two times a day (BID) | ORAL | 3 refills | Status: DC

## 2016-09-17 MED ORDER — RIZATRIPTAN BENZOATE 10 MG PO TABS: 10.0000 mg | ORAL_TABLET | ORAL | 5 refills | Status: DC | PRN

## 2016-09-17 MED ORDER — CYCLOBENZAPRINE HCL 5 MG PO TABS: 10.0000 mg | ORAL_TABLET | Freq: Three times a day (TID) | ORAL | 6 refills | Status: DC | PRN

## 2016-09-17 MED ORDER — LEVONORGEST-ETH ESTRAD 91-DAY 0.15-0.03 &0.01 MG PO TABS: 1.0000 | ORAL_TABLET | Freq: Every day | ORAL | 4 refills | Status: DC

## 2016-09-17 NOTE — Progress Notes (Signed)
Subjective:   Diana Whitaker is a 36 y.o. G33P0 Caucasian female here for a routine well-woman exam.  Patient's last menstrual period was 08/19/2016.    Current complaints: frequent migraines PCP: Manuella Ghazi       doesn't desire labs  Social History: Sexual: heterosexual Marital Status: married Living situation: with family Occupation: Insurance underwriter EMS Tobacco/alcohol: no tobacco use Illicit drugs: no history of illicit drug use  The following portions of the patient's history were reviewed and updated as appropriate: allergies, current medications, past family history, past medical history, past social history, past surgical history and problem list.  Past Medical History Past Medical History:  Diagnosis Date  . Migraines     Past Surgical History History reviewed. No pertinent surgical history.  Gynecologic History G1P0  Patient's last menstrual period was 08/19/2016. Contraception: OCP (estrogen/progesterone) Last Pap: 2017. Results were: normal Last mammogram: 2017. Results were: normal   Obstetric History OB History  Gravida Para Term Preterm AB Living  1            SAB TAB Ectopic Multiple Live Births               # Outcome Date GA Lbr Len/2nd Weight Sex Delivery Anes PTL Lv  1 Gravida 2014    M Vag-Spont         Current Medications Current Outpatient Prescriptions on File Prior to Visit  Medication Sig Dispense Refill  . acyclovir (ZOVIRAX) 400 MG tablet Take 1 tablet (400 mg total) by mouth 2 (two) times daily. 60 tablet 3  . ibuprofen (ADVIL,MOTRIN) 600 MG tablet Take 1 tablet (600 mg total) by mouth every 8 (eight) hours as needed. 30 tablet 0  . Levonorgestrel-Ethinyl Estradiol (AMETHIA,CAMRESE) 0.15-0.03 &0.01 MG tablet Take 1 tablet by mouth daily. 1 Package 4  . ondansetron (ZOFRAN ODT) 8 MG disintegrating tablet Take 1 tablet (8 mg total) by mouth every 8 (eight) hours as needed for nausea or vomiting. 20 tablet 0  . rizatriptan (MAXALT) 10 MG tablet Take 1  tablet (10 mg total) by mouth as needed for migraine. May repeat in 2 hours if needed 10 tablet 2  . cyclobenzaprine (FLEXERIL) 5 MG tablet Take 2 tablets (10 mg total) by mouth at bedtime. (Patient not taking: Reported on 09/17/2016) 30 tablet 0  . zolmitriptan (ZOMIG) 5 MG tablet Take 1 tablet (5 mg total) by mouth as needed for migraine. (Patient not taking: Reported on 09/17/2016) 10 tablet 0   No current facility-administered medications on file prior to visit.     Review of Systems Patient denies any headaches, blurred vision, shortness of breath, chest pain, abdominal pain, problems with bowel movements, urination, or intercourse.  Objective:  BP 129/69   Pulse 67   Ht 5\' 4"  (1.626 m)   Wt 161 lb 3.2 oz (73.1 kg)   LMP 08/19/2016   BMI 27.67 kg/m  Physical Exam  General:  Well developed, well nourished, no acute distress. She is alert and oriented x3. Skin:  Warm and dry Neck:  Midline trachea, no thyromegaly or nodules Cardiovascular: Regular rate and rhythm, no murmur heard Lungs:  Effort normal, all lung fields clear to auscultation bilaterally Breasts:  No dominant palpable mass, retraction, or nipple discharge Abdomen:  Soft, non tender, no hepatosplenomegaly or masses Pelvic:  External genitalia is normal in appearance.  The vagina is normal in appearance. The cervix is bulbous, no CMT.  Thin prep pap is not done. Uterus is felt to be normal size,  shape, and contour.  No adnexal masses or tenderness noted. Extremities:  No swelling or varicosities noted Psych:  She has a normal mood and affect  Assessment:   Healthy well-woman exam Migraines Sleep disturbance   Plan:  Recommend trial of tylenol PM and Sleepy Time tea for 6 weeks, will notify me if sleep habits have not improved. F/U 1 year for Ae, or sooner if needed   Melody Rockney Ghee, CNM

## 2016-09-17 NOTE — Patient Instructions (Signed)

## 2016-09-30 ENCOUNTER — Encounter: Payer: Self-pay | Admitting: Obstetrics and Gynecology

## 2016-11-01 ENCOUNTER — Encounter: Payer: Self-pay | Admitting: Obstetrics and Gynecology

## 2016-11-14 ENCOUNTER — Other Ambulatory Visit: Payer: Self-pay | Admitting: Obstetrics and Gynecology

## 2016-11-23 ENCOUNTER — Emergency Department: Payer: Worker's Compensation

## 2016-11-23 ENCOUNTER — Emergency Department
Admission: EM | Admit: 2016-11-23 | Discharge: 2016-11-23 | Disposition: A | Discharge status: Home / Self Care | Payer: Worker's Compensation | Attending: Emergency Medicine | Admitting: Emergency Medicine

## 2016-11-23 ENCOUNTER — Encounter: Payer: Self-pay | Admitting: Emergency Medicine

## 2016-11-23 DIAGNOSIS — Z79899 Other long term (current) drug therapy: Secondary | ICD-10-CM | POA: Diagnosis not present

## 2016-11-23 DIAGNOSIS — S300XXA Contusion of lower back and pelvis, initial encounter: Secondary | ICD-10-CM | POA: Diagnosis not present

## 2016-11-23 DIAGNOSIS — Y990 Civilian activity done for income or pay: Secondary | ICD-10-CM | POA: Insufficient documentation

## 2016-11-23 DIAGNOSIS — W11XXXA Fall on and from ladder, initial encounter: Secondary | ICD-10-CM | POA: Diagnosis not present

## 2016-11-23 DIAGNOSIS — Y9389 Activity, other specified: Secondary | ICD-10-CM | POA: Insufficient documentation

## 2016-11-23 DIAGNOSIS — S3992XA Unspecified injury of lower back, initial encounter: Secondary | ICD-10-CM | POA: Diagnosis present

## 2016-11-23 DIAGNOSIS — Y9289 Other specified places as the place of occurrence of the external cause: Secondary | ICD-10-CM | POA: Diagnosis not present

## 2016-11-23 LAB — POCT PREGNANCY, URINE: PREG TEST UR: NEGATIVE

## 2016-11-23 MED ORDER — ONDANSETRON 4 MG PO TBDP
4.0000 mg | ORAL_TABLET | Freq: Once | ORAL | Status: AC
  Administered 2016-11-23: 4 mg via ORAL
  Filled 2016-11-23: qty 1

## 2016-11-23 MED ORDER — HYDROCODONE-ACETAMINOPHEN 5-325 MG PO TABS
1.0000 | ORAL_TABLET | Freq: Once | ORAL | Status: AC
  Administered 2016-11-23: 1 via ORAL
  Filled 2016-11-23: qty 1

## 2016-11-23 MED ORDER — HYDROCODONE-ACETAMINOPHEN 5-325 MG PO TABS: 1.0000 | ORAL_TABLET | Freq: Three times a day (TID) | ORAL | 0 refills | Status: DC | PRN

## 2016-11-23 NOTE — ED Notes (Signed)
Pt POCT Results were Negative

## 2016-11-23 NOTE — ED Notes (Signed)
Spoke with Luna Fuse, Shift Supervisor with [facility] who states no urine drug screen is needed for workers comp.

## 2016-11-23 NOTE — Discharge Instructions (Signed)
Your exam and x-ray are consistent with a tailbone contusion. No obvious fracture seen on exam. Take the pain medicine as needed. Take OTC ibuprofen for non-drowsy pain relief. Follow-up with your provider or Dr. Rudene Christians for continued symptoms. Consider a daily stool softener as needed.

## 2016-11-23 NOTE — ED Provider Notes (Signed)
[facility] Emergency Department Provider Note ____________________________________________  Time seen: 2005  I have reviewed the triage vital signs and the nursing notes.  HISTORY  Chief Complaint  Tailbone Pain  HPI Diana Whitaker is a 36 y.o. female presents to the ED for evaluation of tailbone pain, following a mechanical fall. 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 She fell backwards, hitting her buttocks on the bin attached to the wall. 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 She presents with increased pain, stiffness, and tenderness to the left buttock and tailbone. She notes pain is increased with attempts to transition from sit to stand. She denies any other injury at this time.   Past Medical History:  Diagnosis Date  . Migraines     Patient Active Problem List   Diagnosis Date Noted  . Chronic constipation 08/30/2015  . Migraines 08/30/2015  . Overweight (BMI 25.0-29.9) 08/30/2015  . ALLERGIC RHINITIS 08/26/2007  . PALPITATIONS 08/26/2007    History reviewed. No pertinent surgical history.  Prior to Admission medications   Medication Sig Start Date End Date Taking? Authorizing Provider  acyclovir (ZOVIRAX) 400 MG tablet Take 1 tablet (400 mg total) by mouth 2 (two) times daily. 09/17/16   Shambley, Melody N, CNM  cyclobenzaprine (FLEXERIL) 5 MG tablet Take 2 tablets (10 mg total) by mouth 3 (three) times daily as needed for muscle spasms. 09/17/16   Shambley, Melody N, CNM  HYDROcodone-acetaminophen (NORCO) 5-325 MG tablet Take 1 tablet by mouth 3 (three) times daily as needed. 11/23/16   Jerime Arif, Dannielle Karvonen, PA-C  ibuprofen (ADVIL,MOTRIN) 800 MG tablet Take 1 tablet (800 mg total) by mouth every 8 (eight) hours as needed for moderate pain. 09/17/16   Shambley, Melody N, CNM  Levonorgestrel-Ethinyl Estradiol (AMETHIA,CAMRESE) 0.15-0.03 &0.01 MG tablet TAKE 1 TABLET BY  MOUTH DAILY. 11/14/16   Shambley, Melody N, CNM  ondansetron (ZOFRAN ODT) 8 MG disintegrating tablet Take 1 tablet (8 mg total) by mouth every 8 (eight) hours as needed for nausea or vomiting. 09/17/16   Shambley, Melody N, CNM  rizatriptan (MAXALT) 10 MG tablet Take 1 tablet (10 mg total) by mouth as needed for migraine. May repeat in 2 hours if needed 09/17/16   Shambley, Melody N, CNM  zolmitriptan (ZOMIG) 5 MG tablet Take 1 tablet (5 mg total) by mouth as needed for migraine. Patient not taking: Reported on 09/17/2016 06/25/16   Blima Singer, NP    Allergies Patient has no known allergies.  Family History  Problem Relation Age of Onset  . Adopted: Yes    Social History Social History  Substance Use Topics  . Smoking status: Never Smoker  . Smokeless tobacco: Never Used  . Alcohol use No    Review of Systems  Constitutional: Negative for fever. Cardiovascular: Negative for chest pain. Respiratory: Negative for shortness of breath. Gastrointestinal: Negative for abdominal pain, vomiting and diarrhea. Genitourinary: Negative for dysuria. Musculoskeletal: Negative for back pain. Tailbone/buttock pain as above Skin: Negative for rash. Neurological: Negative for headaches, focal weakness or numbness. ____________________________________________  PHYSICAL EXAM:  VITAL SIGNS: ED Triage Vitals  Enc Vitals Group     BP 11/23/16 1901 131/86     Pulse Rate 11/23/16 1901 95     Resp 11/23/16 1901 18     Temp 11/23/16 1901 98.8 F (37.1 C)     Temp Source 11/23/16 1901 Oral  SpO2 11/23/16 1901 100 %     Weight 11/23/16 1902 156 lb (70.8 kg)     Height 11/23/16 1902 5\' 4"  (1.626 m)     Head Circumference --      Peak Flow --      Pain Score 11/23/16 1901 8     Pain Loc --      Pain Edu? --      Excl. in [place]? --     Constitutional: Alert and oriented. Well appearing and in no distress. She is standing for comfort, upon entering the room. Head: Normocephalic and  atraumatic. Cardiovascular: Normal rate, regular rhythm. Normal distal pulses. Respiratory: Normal respiratory effort. No wheezes/rales/rhonchi. Musculoskeletal: Normal spinal alignment without midline tenderness, spasm, deformity, or step-off.  Patient is noted palpation to the top of the gluteal cleft.  She also with some tenderness to the palpation over the left ischial ramus.  Nontender with normal range of motion in all extremities.  Neurologic:  Normal gait without ataxia. Normal speech and language. No gross focal neurologic deficits are appreciated. Skin:  Skin is warm, dry and intact. No rash noted. ____________________________________________   RADIOLOGY  Coccyx IMPRESSION: Negative.  I, Nina Hoar, Dannielle Karvonen, personally viewed and evaluated these images (plain radiographs) as part of my medical decision making, as well as reviewing the written report by the radiologist.  ____________________________________________  PROCEDURES  Norco 5-325 mg PO ____________________________________________  INITIAL IMPRESSION / ASSESSMENT AND PLAN / ED COURSE  Patient with ED evaluation of a work-related, mechanical fall.  She sustained a contusion to the buttocks and coccyx.  While the x-ray is read as negative overall, does appear to some significant deformity at the distal coccyx.  Patient is notified of the exam x-ray findings.  She is discharged with a prescription for hydrocodone for breakthrough pain.  She will dose her previous prescriptions for cyclobenzaprine as well as over-the-counter ibuprofen.  She will return to work next week as scheduled.  Return precautions are reviewed. ____________________________________________  FINAL CLINICAL IMPRESSION(S) / ED DIAGNOSES  Final diagnoses:  Coccyx contusion, initial encounter  Contusion of buttock, initial encounter      Melvenia Needles, PA-C 11/23/16 2331    Carrie Mew, MD 11/24/16 [phone]

## 2016-11-23 NOTE — ED Notes (Signed)
Pt was informed that we need a urine sample/ pt attempting to provide urine sample at this time

## 2016-11-23 NOTE — ED Triage Notes (Signed)
Fell off stepladder approx 11am, tailbone pain.

## 2017-02-28 ENCOUNTER — Other Ambulatory Visit: Payer: Self-pay | Admitting: Obstetrics and Gynecology

## 2017-02-28 ENCOUNTER — Telehealth: Payer: Self-pay | Admitting: Obstetrics and Gynecology

## 2017-02-28 NOTE — Telephone Encounter (Signed)
The patient called and stated that she is out of her medication rizatriptan (MAXALT) 10 MG tablet, The patient would like to have Amy give her a call back to decide if someone else can see her and fill it or if the medication can get a refill. No other information was disclosed. Please advise.

## 2017-02-28 NOTE — Telephone Encounter (Signed)
Done-ac 

## 2017-03-06 ENCOUNTER — Encounter: Payer: Self-pay | Admitting: Family Medicine

## 2017-03-06 ENCOUNTER — Ambulatory Visit: Payer: Self-pay | Admitting: Family Medicine

## 2017-03-06 VITALS — BP 110/70 | HR 72 | Temp 98.3°F

## 2017-03-06 DIAGNOSIS — H6692 Otitis media, unspecified, left ear: Secondary | ICD-10-CM

## 2017-03-06 DIAGNOSIS — H1031 Unspecified acute conjunctivitis, right eye: Secondary | ICD-10-CM

## 2017-03-06 MED ORDER — OFLOXACIN 0.3 % OP SOLN: 2.0000 [drp] | Freq: Four times a day (QID) | OPHTHALMIC | 0 refills | Status: DC

## 2017-03-06 MED ORDER — AMOXICILLIN-POT CLAVULANATE 875-125 MG PO TABS: 1.0000 | ORAL_TABLET | Freq: Two times a day (BID) | ORAL | 0 refills | Status: DC

## 2017-03-06 NOTE — Progress Notes (Signed)
Patient ID: Diana Whitaker, female    DOB: 1980/03/09  Age: 37 y.o. MRN: [id number]  Chief Complaint  Patient presents with  . Conjunctivitis    Subjective:   Last night during the night she developed crusting and irritation of her right eye.  She had to wash the mucus out of it.  She also had a little sensation of needing to pop her left ear.  She did have her recent mild cold and has a slight cough.  She has a 72-year-old child.  She works as an Public relations account executive.  She realizes that she does get exposed to various things.  General he is a healthy person.  Takes birth control and migraine medicines.  He is married, and her husband is a Designer, television/film set.  Current allergies, medications, problem list, past/family and social histories reviewed.  Objective:  BP 110/70   Pulse 72   Temp 98.3 F (36.8 C) (Oral)   SpO2 99%   No major acute distress.  Right eye is obviously injected.  Eyelids are not swollen.  No crusting visible right now.  No history of foreign object.  EOMs intact.  Conjunctivae is inflamed.  Left eye looks fine.  Right TM is normal.  Left ear has a tiny bit of wax along the wall of the canal.  Drum is well visualized however.  It is definitely getting erythematous.  Is not having pain from it yet.  Throat clear.  Neck supple without nodes.  Assessment & Plan:   Assessment: 1. Acute conjunctivitis of right eye, unspecified acute conjunctivitis type   2. Left otitis media, unspecified otitis media type       Plan: Early otitis.  Should treat this was with antibiotics.  Will treat the eye with some antibiotic eyedrops also, though cannot tell for certain whether it is bacterial or viral.  No orders of the defined types were placed in this encounter.   No orders of the defined types were placed in this encounter.        Patient Instructions  Good handwashing.  Wash the eyelids if they are stuck together with mucus.   To try and keep your eustachian tube open better take an  antihistamine decongestant or plain.  Suggest Claritin-D or Allegra-D.   If the ear does not seem to be improving you can also take some over-the-counter Flonase  Ofloxacin eyedrops 1-2 drops right eye 4 times daily  Augmentin 875 1 twice daily for ear infection  Return if further problems   Bacterial Conjunctivitis Bacterial conjunctivitis is an infection of the clear membrane that covers the white part of your eye and the inner surface of your eyelid (conjunctiva). When the blood vessels in your conjunctiva become inflamed, your eye becomes red or pink, and it will probably feel itchy. Bacterial conjunctivitis spreads very easily from person to person (is contagious). It also spreads easily from one eye to the other eye. What are the causes? This condition is caused by several common bacteria. You may get the infection if you come into close contact with another person who is infected. You may also come into contact with items that are contaminated with the bacteria, such as a face towel, contact lens solution, or eye makeup. What increases the risk? This condition is more likely to develop in people who:  Are exposed to other people who have the infection.  Wear contact lenses.  Have a sinus infection.  Have had a recent eye injury or surgery.  Have  a weak body defense system (immune system).  Have a medical condition that causes dry eyes.  What are the signs or symptoms? Symptoms of this condition include:  Eye redness.  Tearing or watery eyes.  Itchy eyes.  Burning feeling in your eyes.  Thick, yellowish discharge from an eye. This may turn into a crust on the eyelid overnight and cause your eyelids to stick together.  Swollen eyelids.  Blurred vision.  How is this diagnosed? Your health care provider can diagnose this condition based on your symptoms and medical history. Your health care provider may also take a sample of discharge from your eye to find the cause  of your infection. This is rarely done. How is this treated? Treatment for this condition includes:  Antibiotic eye drops or ointment to clear the infection more quickly and prevent the spread of infection to others.  Oral antibiotic medicines to treat infections that do not respond to drops or ointments, or last longer than 10 days.  Cool, wet cloths (cool compresses) placed on the eyes.  Artificial tears applied 2-6 times a day.  Follow these instructions at home: Medicines  Take or apply your antibiotic medicine as told by your health care provider. Do not stop taking or applying the antibiotic even if you start to feel better.  Take or apply over-the-counter and prescription medicines only as told by your health care provider.  Be very careful to avoid touching the edge of your eyelid with the eye drop bottle or the ointment tube when you apply medicines to the affected eye. This will keep you from spreading the infection to your other eye or to other people. Managing discomfort  Gently wipe away any drainage from your eye with a warm, wet washcloth or a cotton ball.  Apply a cool, clean washcloth to your eye for 10-20 minutes, 3-4 times a day. General instructions  Do not wear contact lenses until the inflammation is gone and your health care provider says it is safe to wear them again. Ask your health care provider how to sterilize or replace your contact lenses before you use them again. Wear glasses until you can resume wearing contacts.  Avoid wearing eye makeup until the inflammation is gone. Throw away any old eye cosmetics that may be contaminated.  Change or wash your pillowcase every day.  Do not share towels or washcloths. This may spread the infection.  Wash your hands often with soap and water. Use paper towels to dry your hands.  Avoid touching or rubbing your eyes.  Do not drive or use heavy machinery if your vision is blurred. Contact a health care provider  if:  You have a fever.  Your symptoms do not get better after 10 days. Get help right away if:  You have a fever and your symptoms suddenly get worse.  You have severe pain when you move your eye.  You have facial pain, redness, or swelling.  You have sudden loss of vision. This information is not intended to replace advice given to you by your health care provider. Make sure you discuss any questions you have with your health care provider. Document Released: 01/07/2005 Document Revised: 05/18/2015 Document Reviewed: 10/20/2014 Elsevier Interactive Patient Education  2017 [employer].     Return if symptoms worsen or fail to improve.   Ishmeal Rorie, MD 03/06/2017

## 2017-03-06 NOTE — Patient Instructions (Signed)
Good handwashing.  Wash the eyelids if they are stuck together with mucus.   To try and keep your eustachian tube open better take an antihistamine decongestant or plain.  Suggest Claritin-D or Allegra-D.   If the ear does not seem to be improving you can also take some over-the-counter Flonase  Ofloxacin eyedrops 1-2 drops right eye 4 times daily  Augmentin 875 1 twice daily for ear infection  Return if further problems   Bacterial Conjunctivitis Bacterial conjunctivitis is an infection of the clear membrane that covers the white part of your eye and the inner surface of your eyelid (conjunctiva). When the blood vessels in your conjunctiva become inflamed, your eye becomes red or pink, and it will probably feel itchy. Bacterial conjunctivitis spreads very easily from person to person (is contagious). It also spreads easily from one eye to the other eye. What are the causes? This condition is caused by several common bacteria. You may get the infection if you come into close contact with another person who is infected. You may also come into contact with items that are contaminated with the bacteria, such as a face towel, contact lens solution, or eye makeup. What increases the risk? This condition is more likely to develop in people who:  Are exposed to other people who have the infection.  Wear contact lenses.  Have a sinus infection.  Have had a recent eye injury or surgery.  Have a weak body defense system (immune system).  Have a medical condition that causes dry eyes.  What are the signs or symptoms? Symptoms of this condition include:  Eye redness.  Tearing or watery eyes.  Itchy eyes.  Burning feeling in your eyes.  Thick, yellowish discharge from an eye. This may turn into a crust on the eyelid overnight and cause your eyelids to stick together.  Swollen eyelids.  Blurred vision.  How is this diagnosed? Your health care provider can diagnose this condition  based on your symptoms and medical history. Your health care provider may also take a sample of discharge from your eye to find the cause of your infection. This is rarely done. How is this treated? Treatment for this condition includes:  Antibiotic eye drops or ointment to clear the infection more quickly and prevent the spread of infection to others.  Oral antibiotic medicines to treat infections that do not respond to drops or ointments, or last longer than 10 days.  Cool, wet cloths (cool compresses) placed on the eyes.  Artificial tears applied 2-6 times a day.  Follow these instructions at home: Medicines  Take or apply your antibiotic medicine as told by your health care provider. Do not stop taking or applying the antibiotic even if you start to feel better.  Take or apply over-the-counter and prescription medicines only as told by your health care provider.  Be very careful to avoid touching the edge of your eyelid with the eye drop bottle or the ointment tube when you apply medicines to the affected eye. This will keep you from spreading the infection to your other eye or to other people. Managing discomfort  Gently wipe away any drainage from your eye with a warm, wet washcloth or a cotton ball.  Apply a cool, clean washcloth to your eye for 10-20 minutes, 3-4 times a day. General instructions  Do not wear contact lenses until the inflammation is gone and your health care provider says it is safe to wear them again. Ask your health care provider  how to sterilize or replace your contact lenses before you use them again. Wear glasses until you can resume wearing contacts.  Avoid wearing eye makeup until the inflammation is gone. Throw away any old eye cosmetics that may be contaminated.  Change or wash your pillowcase every day.  Do not share towels or washcloths. This may spread the infection.  Wash your hands often with soap and water. Use paper towels to dry your  hands.  Avoid touching or rubbing your eyes.  Do not drive or use heavy machinery if your vision is blurred. Contact a health care provider if:  You have a fever.  Your symptoms do not get better after 10 days. Get help right away if:  You have a fever and your symptoms suddenly get worse.  You have severe pain when you move your eye.  You have facial pain, redness, or swelling.  You have sudden loss of vision. This information is not intended to replace advice given to you by your health care provider. Make sure you discuss any questions you have with your health care provider. Document Released: 01/07/2005 Document Revised: 05/18/2015 Document Reviewed: 10/20/2014 Elsevier Interactive Patient Education  2017 [employer].

## 2017-03-31 DIAGNOSIS — M545 Low back pain, unspecified: Secondary | ICD-10-CM | POA: Insufficient documentation

## 2017-04-18 DIAGNOSIS — S7001XA Contusion of right hip, initial encounter: Secondary | ICD-10-CM | POA: Insufficient documentation

## 2017-04-18 DIAGNOSIS — S39012A Strain of muscle, fascia and tendon of lower back, initial encounter: Secondary | ICD-10-CM | POA: Insufficient documentation

## 2017-06-10 ENCOUNTER — Other Ambulatory Visit: Payer: Self-pay

## 2017-06-10 VITALS — BP 118/67 | Ht 64.0 in | Wt 155.0 lb

## 2017-06-10 DIAGNOSIS — Z0189 Encounter for other specified special examinations: Principal | ICD-10-CM

## 2017-06-10 DIAGNOSIS — Z008 Encounter for other general examination: Secondary | ICD-10-CM

## 2017-06-10 LAB — GLUCOSE, POCT (MANUAL RESULT ENTRY): POC GLUCOSE: 81 mg/dL (ref 70–99)

## 2017-06-11 ENCOUNTER — Telehealth: Payer: Self-pay | Admitting: Obstetrics and Gynecology

## 2017-06-11 LAB — LIPID PANEL
CHOLESTEROL TOTAL: 121 mg/dL (ref 100–199)
Chol/HDL Ratio: 3.7 ratio (ref 0.0–4.4)
HDL: 33 mg/dL — ABNORMAL LOW (ref >39)
LDL Calculated: 67 mg/dL (ref 0–99)
Triglycerides: 107 mg/dL (ref 0–149)
VLDL Cholesterol Cal: 21 mg/dL (ref 5–40)

## 2017-06-11 NOTE — Progress Notes (Signed)
Dear Ms. Landry Mellow, I wanted to let you know that your lipid panel came back.  Everything is normal, with the exception of your HDL cholesterol. Your HDL cholesterol ("good cholesterol") is decreased at 33, normal values are above 39. I want you to follow-up with your primary care provider regarding these results.

## 2017-06-11 NOTE — Telephone Encounter (Signed)
The patient called and stated that she needs to see a primary care provider in regards to her lab results she got back from her bio metric screening (Lipids/HCL levels being low). She was advised to f/u w/ PCP. Please advisde.

## 2017-06-11 NOTE — Telephone Encounter (Signed)
Hey Melody do you want to handle this??  Pls advise

## 2017-06-12 NOTE — Telephone Encounter (Signed)
Notified pt. 

## 2017-06-12 NOTE — Telephone Encounter (Signed)
She will need to get established with a PCP if she doesn't want to see city physician.  Diana Whitaker

## 2017-06-25 ENCOUNTER — Encounter: Payer: Self-pay | Admitting: Family Medicine

## 2017-06-25 ENCOUNTER — Ambulatory Visit: Payer: Managed Care, Other (non HMO) | Admitting: Family Medicine

## 2017-06-25 VITALS — BP 118/78 | HR 73 | Temp 98.2°F | Ht 64.5 in | Wt 155.8 lb

## 2017-06-25 DIAGNOSIS — K5900 Constipation, unspecified: Secondary | ICD-10-CM | POA: Diagnosis not present

## 2017-06-25 DIAGNOSIS — E786 Lipoprotein deficiency: Secondary | ICD-10-CM

## 2017-06-25 DIAGNOSIS — Z7689 Persons encountering health services in other specified circumstances: Secondary | ICD-10-CM

## 2017-06-25 NOTE — Patient Instructions (Addendum)
Try Miralax (generic is fine) nightly, can increase by a dose every night, increase water and fiber to 30-40 mg daily  Exercise can help raise your HDL   Constipation, Adult Constipation is when a person has fewer bowel movements in a week than normal, has difficulty having a bowel movement, or has stools that are dry, hard, or larger than normal. Constipation may be caused by an underlying condition. It may become worse with age if a person takes certain medicines and does not take in enough fluids. Follow these instructions at home: Eating and drinking   Eat foods that have a lot of fiber, such as fresh fruits and vegetables, whole grains, and beans.  Limit foods that are high in fat, low in fiber, or overly processed, such as french fries, hamburgers, cookies, candies, and soda.  Drink enough fluid to keep your urine clear or pale yellow. General instructions  Exercise regularly or as told by your health care provider.  Go to the restroom when you have the urge to go. Do not hold it in.  Take over-the-counter and prescription medicines only as told by your health care provider. These include any fiber supplements.  Practice pelvic floor retraining exercises, such as deep breathing while relaxing the lower abdomen and pelvic floor relaxation during bowel movements.  Watch your condition for any changes.  Keep all follow-up visits as told by your health care provider. This is important. Contact a health care provider if:  You have pain that gets worse.  You have a fever.  You do not have a bowel movement after 4 days.  You vomit.  You are not hungry.  You lose weight.  You are bleeding from the anus.  You have thin, pencil-like stools. Get help right away if:  You have a fever and your symptoms suddenly get worse.  You leak stool or have blood in your stool.  Your abdomen is bloated.  You have severe pain in your abdomen.  You feel dizzy or you faint. This  information is not intended to replace advice given to you by your health care provider. Make sure you discuss any questions you have with your health care provider. Document Released: 10/06/2003 Document Revised: 07/28/2015 Document Reviewed: 06/28/2015 Elsevier Interactive Patient Education  2018 [employer].

## 2017-06-25 NOTE — Progress Notes (Signed)
   Subjective:    Patient ID: Diana Whitaker, female    DOB: Feb 21, 1980, 37 y.o.   MRN: [id number]  HPI This is a 37 yo female who presents to establish care. She works as a Designer, television/film set x 19 years. Lives with her husband and son who is 31 yo. Enjoys travel.   Low HDL- found on biometric screening  Abdominal bloating/constipation- for several years, [employer] messed up her stomach. Stool not always hard. Has bm every 2-3 days. Occasional hemorrhoids.    Last CPE- sees gyn annually Mammo- 09/11/15 Pap- 08/30/15, negative, negative HPV Tdap- thinks she has had within last 5 years for work, she will look for her record Flu- annual Eye- regular Dental- regular Exercise- intermittent    Past Medical History:  Diagnosis Date  . Arthritis   . Migraines   . Migraines 1999   No past surgical history on file. Family History  Adopted: Yes   Social History   Tobacco Use  . Smoking status: Never Smoker  . Smokeless tobacco: Never Used  Substance Use Topics  . Alcohol use: No  . Drug use: No      Review of Systems Per HPI    Objective:   Physical Exam  Constitutional: She is oriented to person, place, and time. She appears well-developed and well-nourished. No distress.  HENT:  Head: Normocephalic and atraumatic.  Eyes: Conjunctivae are normal.  Cardiovascular: Normal rate, regular rhythm and normal heart sounds.  Pulmonary/Chest: Effort normal and breath sounds normal.  Abdominal: Soft. Bowel sounds are normal. She exhibits no distension and no mass. There is no tenderness. There is no rebound and no guarding.  Neurological: She is alert and oriented to person, place, and time.  Skin: Skin is warm and dry. She is not diaphoretic.  Psychiatric: She has a normal mood and affect. Her behavior is normal. Judgment and thought content normal.  Vitals reviewed.    BP 118/78 (BP Location: Left Arm, Patient Position: Sitting, Cuff Size: Normal)   Pulse 73   Temp 98.2 F (36.8 C)  (Oral)   Ht 5' 4.5" (1.638 m)   Wt 155 lb 12 oz (70.6 kg)   LMP 05/28/2017   SpO2 98%   BMI 26.32 kg/m  Wt Readings from Last 3 Encounters:  06/25/17 155 lb 12 oz (70.6 kg)  06/10/17 155 lb (70.3 kg)  11/23/16 156 lb (70.8 kg)        Assessment & Plan:  1. Encounter to establish care - Discussed and encouraged healthy lifestyle choices- adequate sleep, regular exercise, stress management and healthy food choices.   2. Constipation, unspecified constipation type - discussed use of probiotics, Miralax, increasing dietary fiber - TSH  3. Low HDL (under 40) - reviewed lab results and discussed ways to increase HDL levels  - follow up in 1 year  Clarene Reamer, FNP-BC  [facility] [place]  06/29/2017 10:55 AM

## 2017-06-26 LAB — TSH: TSH: 1.29 u[IU]/mL (ref 0.35–4.50)

## 2017-06-27 ENCOUNTER — Encounter: Payer: Self-pay | Admitting: Family Medicine

## 2017-06-29 ENCOUNTER — Encounter: Payer: Self-pay | Admitting: Family Medicine

## 2017-07-17 ENCOUNTER — Emergency Department
Admission: EM | Admit: 2017-07-17 | Discharge: 2017-07-17 | Disposition: A | Discharge status: Home / Self Care | Payer: No Typology Code available for payment source | Attending: Emergency Medicine | Admitting: Emergency Medicine

## 2017-07-17 ENCOUNTER — Other Ambulatory Visit: Payer: Self-pay

## 2017-07-17 DIAGNOSIS — Y990 Civilian activity done for income or pay: Secondary | ICD-10-CM | POA: Insufficient documentation

## 2017-07-17 DIAGNOSIS — W461XXA Contact with contaminated hypodermic needle, initial encounter: Secondary | ICD-10-CM | POA: Diagnosis not present

## 2017-07-17 DIAGNOSIS — Y92818 Other transport vehicle as the place of occurrence of the external cause: Secondary | ICD-10-CM | POA: Diagnosis not present

## 2017-07-17 DIAGNOSIS — Y9389 Activity, other specified: Secondary | ICD-10-CM | POA: Insufficient documentation

## 2017-07-17 DIAGNOSIS — S61432A Puncture wound without foreign body of left hand, initial encounter: Secondary | ICD-10-CM | POA: Insufficient documentation

## 2017-07-17 DIAGNOSIS — Z578 Occupational exposure to other risk factors: Secondary | ICD-10-CM

## 2017-07-17 NOTE — ED Provider Notes (Signed)
[facility] Emergency Department Provider Note       Time seen: ----------------------------------------- 10:17 PM on 07/17/2017 -----------------------------------------   I have reviewed the triage vital signs and the nursing notes.  HISTORY   Chief Complaint Body Fluid Exposure    HPI Diana Whitaker is a 37 y.o. female with a history of arthritis and migraines who presents to the ED for possible occupational body fluid exposure.  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  Patient states she scraped her hand thoroughly and she did not see any blood come from any particular wound but she thought it did penetrate the skin.  She denies any other complaints.  Past Medical History:  Diagnosis Date  . Arthritis   . Migraines   . Migraines 1999    Patient Active Problem List   Diagnosis Date Noted  . Chronic constipation 08/30/2015  . Migraines 08/30/2015  . Overweight (BMI 25.0-29.9) 08/30/2015  . ALLERGIC RHINITIS 08/26/2007  . PALPITATIONS 08/26/2007    No past surgical history on file.  Allergies Patient has no known allergies.  Social History Social History   Tobacco Use  . Smoking status: Never Smoker  . Smokeless tobacco: Never Used  Substance Use Topics  . Alcohol use: No  . Drug use: No   Review of Systems Musculoskeletal: Positive for left hand pain Skin: Positive for possible needlestick  All systems negative/normal/unremarkable except as stated in the HPI  ____________________________________________   PHYSICAL EXAM:  VITAL SIGNS: ED Triage Vitals  Enc Vitals Group     BP 07/17/17 2216 (!) 123/93     Pulse Rate 07/17/17 2216 93     Resp 07/17/17 2216 18     Temp 07/17/17 2216 98.1 F (36.7 C)     Temp Source 07/17/17 2216 Oral     SpO2 07/17/17 2216 98 %     Weight --      Height --      Head Circumference --      Peak Flow --      Pain Score  07/17/17 2217 0     Pain Loc --      Pain Edu? --      Excl. in [place]? --    Constitutional: Alert and oriented. Well appearing and in no distress. Musculoskeletal: Nontender with normal range of motion in extremities. No lower extremity tenderness nor edema.  There is a possible puncture wound on the palmar surface medially of her left hand but not definite Neurologic:  Normal speech and language.  Skin: Possible puncture wound but otherwise unremarkable skin examination Psychiatric: Mood and affect are normal.  ____________________________________________  ED COURSE:  As part of my medical decision making, I reviewed the following data within the [place] History obtained from family if available, nursing notes, old chart and ekg, as well as notes from prior ED visits. Patient presented for possible puncture wound or occupational body fluid exposure, we will assess with labs and reevaluate.   Procedures ____________________________________________   LABS (pertinent positives/negatives)  Labs Reviewed - No data to display [facility] labs have been obtained  FINAL ASSESSMENT AND PLAN  Occupational body fluid exposure   Plan: The patient had presented for occupational body fluid exposure.  Labs were obtained.  I do not think that postexposure prophylaxis is warranted at this time but she will have scheduled repeat testing performed.   Johnathan  Alain Honey, MD   Note: This note was generated in part or whole with voice recognition software. Voice recognition is usually quite accurate but there are transcription errors that can and very often do occur. I apologize for any typographical errors that were not detected and corrected.     Earleen Newport, MD 07/17/17 2222
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
## 2017-07-17 NOTE — ED Notes (Signed)
Patient discharged to home per MD order. Patient in stable condition, and deemed medically cleared by ED provider for discharge. Discharge instructions reviewed with patient/family using "Teach Back"; verbalized understanding of medication education and administration, and information about follow-up care. Denies further concerns. ° °

## 2017-08-20 ENCOUNTER — Other Ambulatory Visit: Payer: Self-pay | Admitting: Obstetrics and Gynecology

## 2017-09-25 ENCOUNTER — Encounter: Payer: Managed Care, Other (non HMO) | Admitting: Obstetrics and Gynecology

## 2017-09-30 ENCOUNTER — Ambulatory Visit (INDEPENDENT_AMBULATORY_CARE_PROVIDER_SITE_OTHER): Payer: Managed Care, Other (non HMO) | Admitting: Obstetrics and Gynecology

## 2017-09-30 ENCOUNTER — Encounter: Payer: Self-pay | Admitting: Obstetrics and Gynecology

## 2017-09-30 VITALS — BP 112/69 | HR 66 | Ht 64.0 in | Wt 156.6 lb

## 2017-09-30 DIAGNOSIS — R1011 Right upper quadrant pain: Secondary | ICD-10-CM | POA: Diagnosis not present

## 2017-09-30 DIAGNOSIS — Z01419 Encounter for gynecological examination (general) (routine) without abnormal findings: Secondary | ICD-10-CM | POA: Diagnosis not present

## 2017-09-30 MED ORDER — POLYETHYLENE GLYCOL 3350 17 GM/SCOOP PO POWD: 1.0000 | Freq: Once | ORAL | 0 refills | Status: AC

## 2017-09-30 MED ORDER — LEVONORGEST-ETH ESTRAD 91-DAY 0.15-0.03 &0.01 MG PO TABS: 1.0000 | ORAL_TABLET | Freq: Every day | ORAL | 4 refills | Status: DC

## 2017-09-30 NOTE — Progress Notes (Signed)
Subjective:   Diana Whitaker is a 37 y.o. G6P0 Caucasian female here for a routine well-woman exam.  Patient's last menstrual period was 08/21/2017.    Current complaints: RUQ pain and pressure, tender to touch, no relationship to food and no change in positions. Denies any other symptoms. Has not tried any medications.  PCP: Manuella Ghazi       does desire labs  Social History: Sexual: heterosexual Marital Status: married Living situation: with family Occupation: Insurance underwriter [employer] Tobacco/alcohol: no tobacco use Illicit drugs: no history of illicit drug use  The following portions of the patient's history were reviewed and updated as appropriate: allergies, current medications, past family history, past medical history, past social history, past surgical history and problem list.  Past Medical History Past Medical History:  Diagnosis Date  . Arthritis   . Migraines   . Migraines 1999    Past Surgical History History reviewed. No pertinent surgical history.  Gynecologic History G1P0  Patient's last menstrual period was 08/21/2017. Contraception: OCP (estrogen/progesterone) Last Pap: 2017. Results were: normal   Obstetric History OB History  Gravida Para Term Preterm AB Living  1            SAB TAB Ectopic Multiple Live Births               # Outcome Date GA Lbr Len/2nd Weight Sex Delivery Anes PTL Lv  1 Gravida 2014    M Vag-Spont       Current Medications Current Outpatient Medications on File Prior to Visit  Medication Sig Dispense Refill  . cyclobenzaprine (FLEXERIL) 10 MG tablet Take 10 mg by mouth 3 (three) times daily as needed for muscle spasms.    Marland Kitchen ibuprofen (ADVIL,MOTRIN) 800 MG tablet Take 800 mg by mouth every 8 (eight) hours as needed.    . Levonorgestrel-Ethinyl Estradiol (AMETHIA,CAMRESE) 0.15-0.03 &0.01 MG tablet TAKE 1 TABLET BY MOUTH DAILY. 91 tablet 4  . rizatriptan (MAXALT) 10 MG tablet TAKE 1 TABLET (10 MG TOTAL) BY MOUTH AS NEEDED FOR MIGRAINE. MAY REPEAT  IN 2 HOURS IF NEEDED 10 tablet 5  . ondansetron (ZOFRAN ODT) 8 MG disintegrating tablet Take 1 tablet (8 mg total) by mouth every 8 (eight) hours as needed for nausea or vomiting. (Patient not taking: Reported on 09/30/2017) 20 tablet 0   No current facility-administered medications on file prior to visit.     Review of Systems Patient denies any headaches, blurred vision, shortness of breath, chest pain, abdominal pain, problems with bowel movements, urination, or intercourse.  Objective:  BP 112/69   Pulse 66   Ht 5\' 4"  (1.626 m)   Wt 156 lb 9.6 oz (71 kg)   LMP 08/21/2017   BMI 26.88 kg/m  Physical Exam  General:  Well developed, well nourished, no acute distress. She is alert and oriented x3. Skin:  Warm and dry Neck:  Midline trachea, no thyromegaly or nodules Cardiovascular: Regular rate and rhythm, no murmur heard Lungs:  Effort normal, all lung fields clear to auscultation bilaterally Breasts:  No dominant palpable mass, retraction, or nipple discharge Abdomen:  Soft, non tender, no hepatosplenomegaly or masses Pelvic:  External genitalia is normal in appearance.  The vagina is normal in appearance. The cervix is bulbous, no CMT.  Thin prep pap is not done. Uterus is felt to be normal size, shape, and contour.  No adnexal masses or tenderness noted. Extremities:  No swelling or varicosities noted Psych:  She has a normal mood and affect  Assessment:   Healthy well-woman exam RUQ pain-intermittent  Plan:  Labs and abdominal ultrasound ordered- will follow up accordingly. F/U 1 year for AE, or sooner if needed   Lysander Calixte Rockney Ghee, CNM

## 2017-09-30 NOTE — Patient Instructions (Signed)
Preventive Care 18-39 Years, Female Preventive care refers to lifestyle choices and visits with your health care provider that can promote health and wellness. What does preventive care include?  A yearly physical exam. This is also called an annual well check.  Dental exams once or twice a year.  Routine eye exams. Ask your health care provider how often you should have your eyes checked.  Personal lifestyle choices, including: ? Daily care of your teeth and gums. ? Regular physical activity. ? Eating a healthy diet. ? Avoiding tobacco and drug use. ? Limiting alcohol use. ? Practicing safe sex. ? Taking vitamin and mineral supplements as recommended by your health care provider. What happens during an annual well check? The services and screenings done by your health care provider during your annual well check will depend on your age, overall health, lifestyle risk factors, and family history of disease. Counseling Your health care provider may ask you questions about your:  Alcohol use.  Tobacco use.  Drug use.  Emotional well-being.  Home and relationship well-being.  Sexual activity.  Eating habits.  Work and work Statistician.  Method of birth control.  Menstrual cycle.  Pregnancy history.  Screening You may have the following tests or measurements:  Height, weight, and BMI.  Diabetes screening. This is done by checking your blood sugar (glucose) after you have not eaten for a while (fasting).  Blood pressure.  Lipid and cholesterol levels. These may be checked every 5 years starting at age 38.  Skin check.  Hepatitis C blood test.  Hepatitis B blood test.  Sexually transmitted disease (STD) testing.  BRCA-related cancer screening. This may be done if you have a family history of breast, ovarian, tubal, or peritoneal cancers.  Pelvic exam and Pap test. This may be done every 3 years starting at age 38. Starting at age 30, this may be done  every 5 years if you have a Pap test in combination with an HPV test.  Discuss your test results, treatment options, and if necessary, the need for more tests with your health care provider. Vaccines Your health care provider may recommend certain vaccines, such as:  Influenza vaccine. This is recommended every year.  Tetanus, diphtheria, and acellular pertussis (Tdap, Td) vaccine. You may need a Td booster every 10 years.  Varicella vaccine. You may need this if you have not been vaccinated.  HPV vaccine. If you are 39 or younger, you may need three doses over 6 months.  Measles, mumps, and rubella (MMR) vaccine. You may need at least one dose of MMR. You may also need a second dose.  Pneumococcal 13-valent conjugate (PCV13) vaccine. You may need this if you have certain conditions and were not previously vaccinated.  Pneumococcal polysaccharide (PPSV23) vaccine. You may need one or two doses if you smoke cigarettes or if you have certain conditions.  Meningococcal vaccine. One dose is recommended if you are age 68-21 years and a first-year college student living in a residence hall, or if you have one of several medical conditions. You may also need additional booster doses.  Hepatitis A vaccine. You may need this if you have certain conditions or if you travel or work in places where you may be exposed to hepatitis A.  Hepatitis B vaccine. You may need this if you have certain conditions or if you travel or work in places where you may be exposed to hepatitis B.  Haemophilus influenzae type b (Hib) vaccine. You may need this  if you have certain risk factors.  Talk to your health care provider about which screenings and vaccines you need and how often you need them. This information is not intended to replace advice given to you by your health care provider. Make sure you discuss any questions you have with your health care provider. Document Released: 03/05/2001 Document Revised:  09/27/2015 Document Reviewed: 11/08/2014 Elsevier Interactive Patient Education  2018 [employer]  

## 2017-10-01 LAB — COMPREHENSIVE METABOLIC PANEL
A/G RATIO: 1.7 (ref 1.2–2.2)
ALT: 15 IU/L (ref 0–32)
AST: 15 IU/L (ref 0–40)
Albumin: 4.5 g/dL (ref 3.5–5.5)
Alkaline Phosphatase: 78 IU/L (ref 39–117)
BUN/Creatinine Ratio: 9 (ref 9–23)
BUN: 7 mg/dL (ref 6–20)
Bilirubin Total: 0.3 mg/dL (ref 0.0–1.2)
CALCIUM: 9.7 mg/dL (ref 8.7–10.2)
CO2: 25 mmol/L (ref 20–29)
Chloride: 101 mmol/L (ref 96–106)
Creatinine, Ser: 0.8 mg/dL (ref 0.57–1.00)
GFR, EST AFRICAN AMERICAN: 107 mL/min/{1.73_m2} (ref >59)
GFR, EST NON AFRICAN AMERICAN: 93 mL/min/{1.73_m2} (ref >59)
GLUCOSE: 74 mg/dL (ref 65–99)
Globulin, Total: 2.7 g/dL (ref 1.5–4.5)
Potassium: 4.1 mmol/L (ref 3.5–5.2)
Sodium: 142 mmol/L (ref 134–144)
Total Protein: 7.2 g/dL (ref 6.0–8.5)

## 2017-10-02 ENCOUNTER — Ambulatory Visit
Admission: RE | Admit: 2017-10-02 | Discharge: 2017-10-02 | Disposition: A | Discharge status: Home / Self Care | Payer: Managed Care, Other (non HMO) | Source: Ambulatory Visit | Attending: Obstetrics and Gynecology | Admitting: Obstetrics and Gynecology

## 2017-10-02 DIAGNOSIS — R1011 Right upper quadrant pain: Secondary | ICD-10-CM | POA: Insufficient documentation

## 2017-10-06 ENCOUNTER — Ambulatory Visit: Payer: No Typology Code available for payment source

## 2017-12-30 ENCOUNTER — Other Ambulatory Visit: Payer: Self-pay | Admitting: Obstetrics and Gynecology

## 2018-01-05 ENCOUNTER — Emergency Department: Payer: No Typology Code available for payment source

## 2018-01-05 ENCOUNTER — Emergency Department
Admission: EM | Admit: 2018-01-05 | Discharge: 2018-01-05 | Disposition: A | Discharge status: Home / Self Care | Payer: No Typology Code available for payment source | Attending: Emergency Medicine | Admitting: Emergency Medicine

## 2018-01-05 ENCOUNTER — Encounter: Payer: Self-pay | Admitting: Emergency Medicine

## 2018-01-05 ENCOUNTER — Other Ambulatory Visit: Payer: Self-pay

## 2018-01-05 DIAGNOSIS — S5012XA Contusion of left forearm, initial encounter: Secondary | ICD-10-CM

## 2018-01-05 DIAGNOSIS — Y939 Activity, unspecified: Secondary | ICD-10-CM | POA: Insufficient documentation

## 2018-01-05 DIAGNOSIS — S161XXA Strain of muscle, fascia and tendon at neck level, initial encounter: Secondary | ICD-10-CM | POA: Diagnosis not present

## 2018-01-05 DIAGNOSIS — Y929 Unspecified place or not applicable: Secondary | ICD-10-CM | POA: Insufficient documentation

## 2018-01-05 DIAGNOSIS — S060XAA Concussion with loss of consciousness status unknown, initial encounter: Secondary | ICD-10-CM | POA: Insufficient documentation

## 2018-01-05 DIAGNOSIS — G540 Brachial plexus disorders: Secondary | ICD-10-CM | POA: Insufficient documentation

## 2018-01-05 DIAGNOSIS — Z79899 Other long term (current) drug therapy: Secondary | ICD-10-CM | POA: Diagnosis not present

## 2018-01-05 DIAGNOSIS — S0990XA Unspecified injury of head, initial encounter: Secondary | ICD-10-CM | POA: Insufficient documentation

## 2018-01-05 DIAGNOSIS — Y999 Unspecified external cause status: Secondary | ICD-10-CM | POA: Insufficient documentation

## 2018-01-05 DIAGNOSIS — S298XXA Other specified injuries of thorax, initial encounter: Secondary | ICD-10-CM

## 2018-01-05 LAB — URINALYSIS, COMPLETE (UACMP) WITH MICROSCOPIC
BILIRUBIN URINE: NEGATIVE
Bacteria, UA: NONE SEEN
GLUCOSE, UA: NEGATIVE mg/dL
KETONES UR: NEGATIVE mg/dL
Nitrite: NEGATIVE
PROTEIN: NEGATIVE mg/dL
Specific Gravity, Urine: 1.01 (ref 1.005–1.030)
pH: 7 (ref 5.0–8.0)

## 2018-01-05 MED ORDER — ONDANSETRON 4 MG PO TBDP
4.0000 mg | ORAL_TABLET | Freq: Once | ORAL | Status: AC
  Administered 2018-01-05: 4 mg via ORAL
  Filled 2018-01-05: qty 1

## 2018-01-05 MED ORDER — MELOXICAM 15 MG PO TABS: 15.0000 mg | ORAL_TABLET | Freq: Every day | ORAL | 2 refills | Status: AC

## 2018-01-05 MED ORDER — OXYCODONE-ACETAMINOPHEN 5-325 MG PO TABS
1.0000 | ORAL_TABLET | Freq: Once | ORAL | Status: AC
  Administered 2018-01-05: 1 via ORAL
  Filled 2018-01-05: qty 1

## 2018-01-05 MED ORDER — BACLOFEN 10 MG PO TABS: 10.0000 mg | ORAL_TABLET | Freq: Three times a day (TID) | ORAL | 1 refills | Status: AC
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
## 2018-01-05 NOTE — Discharge Instructions (Addendum)
Follow-up with either the Worker's Comp. nurse at [facility] or [facility] acute care if you are not better in 3 to 5 days.  Return to the emergency department if worsening abdominal pain.  Take the medications as prescribed.  Apply ice to all areas that hurt.  You may need return to work for your regularly scheduled shift without restrictions.  He should not use the muscle relaxer while at work.
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
## 2018-01-06 ENCOUNTER — Encounter: Payer: Self-pay | Admitting: *Deleted

## 2018-01-06 ENCOUNTER — Emergency Department
Admission: EM | Admit: 2018-01-06 | Discharge: 2018-01-06 | Disposition: A | Discharge status: Home / Self Care | Payer: No Typology Code available for payment source | Attending: Emergency Medicine | Admitting: Emergency Medicine

## 2018-01-06 ENCOUNTER — Other Ambulatory Visit: Payer: Self-pay

## 2018-01-06 ENCOUNTER — Emergency Department: Payer: No Typology Code available for payment source

## 2018-01-06 DIAGNOSIS — Z3202 Encounter for pregnancy test, result negative: Secondary | ICD-10-CM | POA: Insufficient documentation

## 2018-01-06 DIAGNOSIS — Y9389 Activity, other specified: Secondary | ICD-10-CM | POA: Diagnosis not present

## 2018-01-06 DIAGNOSIS — S161XXD Strain of muscle, fascia and tendon at neck level, subsequent encounter: Secondary | ICD-10-CM

## 2018-01-06 DIAGNOSIS — S199XXA Unspecified injury of neck, initial encounter: Secondary | ICD-10-CM | POA: Diagnosis present

## 2018-01-06 DIAGNOSIS — Y999 Unspecified external cause status: Secondary | ICD-10-CM | POA: Diagnosis not present

## 2018-01-06 DIAGNOSIS — F0781 Postconcussional syndrome: Secondary | ICD-10-CM | POA: Diagnosis not present

## 2018-01-06 DIAGNOSIS — Z79899 Other long term (current) drug therapy: Secondary | ICD-10-CM | POA: Insufficient documentation

## 2018-01-06 DIAGNOSIS — G44319 Acute post-traumatic headache, not intractable: Secondary | ICD-10-CM | POA: Insufficient documentation

## 2018-01-06 DIAGNOSIS — S161XXA Strain of muscle, fascia and tendon at neck level, initial encounter: Secondary | ICD-10-CM | POA: Diagnosis not present

## 2018-01-06 DIAGNOSIS — Y92410 Unspecified street and highway as the place of occurrence of the external cause: Secondary | ICD-10-CM | POA: Insufficient documentation

## 2018-01-06 LAB — URINALYSIS, COMPLETE (UACMP) WITH MICROSCOPIC
Bacteria, UA: NONE SEEN
Bilirubin Urine: NEGATIVE
Glucose, UA: NEGATIVE mg/dL
Ketones, ur: NEGATIVE mg/dL
Nitrite: NEGATIVE
PH: 5 (ref 5.0–8.0)
PROTEIN: NEGATIVE mg/dL
Specific Gravity, Urine: 1.021 (ref 1.005–1.030)

## 2018-01-06 LAB — PREGNANCY, URINE: PREG TEST UR: NEGATIVE

## 2018-01-06 MED ORDER — ORPHENADRINE CITRATE 30 MG/ML IJ SOLN
60.0000 mg | INTRAMUSCULAR | Status: AC
  Administered 2018-01-06: 60 mg via INTRAMUSCULAR
  Filled 2018-01-06: qty 2

## 2018-01-06 MED ORDER — DIAZEPAM 2 MG PO TABS: 2.0000 mg | ORAL_TABLET | Freq: Three times a day (TID) | ORAL | 0 refills | Status: DC | PRN

## 2018-01-06 MED ORDER — METOCLOPRAMIDE HCL 10 MG PO TABS
10.0000 mg | ORAL_TABLET | Freq: Once | ORAL | Status: AC
  Administered 2018-01-06: 10 mg via ORAL
  Filled 2018-01-06: qty 1

## 2018-01-06 MED ORDER — DIPHENHYDRAMINE HCL 25 MG PO CAPS
25.0000 mg | ORAL_CAPSULE | Freq: Once | ORAL | Status: AC
  Administered 2018-01-06: 25 mg via ORAL
  Filled 2018-01-06: qty 1

## 2018-01-06 MED ORDER — KETOROLAC TROMETHAMINE 30 MG/ML IJ SOLN
30.0000 mg | Freq: Once | INTRAMUSCULAR | Status: AC
  Administered 2018-01-06: 30 mg via INTRAMUSCULAR
  Filled 2018-01-06: qty 1

## 2018-01-06 MED ORDER — KETOROLAC TROMETHAMINE 10 MG PO TABS: 10.0000 mg | ORAL_TABLET | Freq: Three times a day (TID) | ORAL | 0 refills | Status: DC

## 2018-01-06 MED ORDER — METOCLOPRAMIDE HCL 10 MG PO TABS: 10.0000 mg | ORAL_TABLET | Freq: Three times a day (TID) | ORAL | 0 refills | Status: DC | PRN

## 2018-01-06 MED ORDER — ONDANSETRON 4 MG PO TBDP
4.0000 mg | ORAL_TABLET | Freq: Once | ORAL | Status: AC
  Administered 2018-01-06: 4 mg via ORAL
  Filled 2018-01-06: qty 1

## 2018-01-06 MED ORDER — DIAZEPAM 5 MG PO TABS: 5.0000 mg | ORAL_TABLET | Freq: Once | ORAL | Status: DC
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
## 2018-01-06 NOTE — ED Notes (Addendum)
Patient able to independently repositioned self on stretcher. Patient looks uncomfortable. Patient has emesis bag, states she wishes she could vomit, but just has constant nausea. Patient states she went to [facility] today for a follow-up, but was sent here to be evaluated.
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
## 2018-01-07 LAB — URINE CULTURE

## 2018-01-12 ENCOUNTER — Encounter: Payer: Self-pay | Admitting: Emergency Medicine

## 2018-01-12 ENCOUNTER — Other Ambulatory Visit: Payer: Self-pay

## 2018-01-12 ENCOUNTER — Emergency Department
Admission: EM | Admit: 2018-01-12 | Discharge: 2018-01-12 | Disposition: A | Discharge status: Home / Self Care | Payer: No Typology Code available for payment source | Attending: Emergency Medicine | Admitting: Emergency Medicine

## 2018-01-12 ENCOUNTER — Emergency Department: Payer: No Typology Code available for payment source

## 2018-01-12 DIAGNOSIS — R51 Headache: Secondary | ICD-10-CM | POA: Insufficient documentation

## 2018-01-12 DIAGNOSIS — F0781 Postconcussional syndrome: Secondary | ICD-10-CM | POA: Diagnosis not present

## 2018-01-12 DIAGNOSIS — R110 Nausea: Secondary | ICD-10-CM | POA: Insufficient documentation

## 2018-01-12 DIAGNOSIS — Z79899 Other long term (current) drug therapy: Secondary | ICD-10-CM | POA: Insufficient documentation

## 2018-01-12 MED ORDER — ONDANSETRON 4 MG PO TBDP: 4.0000 mg | ORAL_TABLET | Freq: Three times a day (TID) | ORAL | 0 refills | Status: DC | PRN

## 2018-01-12 MED ORDER — ONDANSETRON 8 MG PO TBDP
8.0000 mg | ORAL_TABLET | Freq: Once | ORAL | Status: AC
  Administered 2018-01-12: 8 mg via ORAL
  Filled 2018-01-12: qty 1

## 2018-01-12 NOTE — ED Notes (Signed)
See triage note. Pt A&Ox4. Family at bedside. PA at bedside.
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
## 2018-01-12 NOTE — Discharge Instructions (Addendum)
Follow-up with your Workmen's Comp. doctor at [facility] clinic.  Take Zofran ODT every 8 hours as needed for nausea or vomiting.

## 2018-01-12 NOTE — ED Notes (Signed)
Pt states nausea is gone.
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
## 2018-06-12 ENCOUNTER — Other Ambulatory Visit: Payer: Self-pay | Admitting: Obstetrics and Gynecology

## 2018-10-02 ENCOUNTER — Encounter: Payer: Managed Care, Other (non HMO) | Admitting: Obstetrics and Gynecology

## 2018-10-07 ENCOUNTER — Other Ambulatory Visit: Payer: Self-pay

## 2018-10-07 ENCOUNTER — Encounter: Payer: Self-pay | Admitting: Obstetrics and Gynecology

## 2018-10-07 ENCOUNTER — Ambulatory Visit (INDEPENDENT_AMBULATORY_CARE_PROVIDER_SITE_OTHER): Payer: Managed Care, Other (non HMO) | Admitting: Obstetrics and Gynecology

## 2018-10-07 VITALS — BP 111/71 | HR 60 | Ht 63.0 in | Wt 163.1 lb

## 2018-10-07 DIAGNOSIS — Z23 Encounter for immunization: Secondary | ICD-10-CM | POA: Diagnosis not present

## 2018-10-07 DIAGNOSIS — Z01419 Encounter for gynecological examination (general) (routine) without abnormal findings: Secondary | ICD-10-CM | POA: Diagnosis not present

## 2018-10-07 MED ORDER — RIZATRIPTAN BENZOATE 10 MG PO TABS: 10.0000 mg | ORAL_TABLET | ORAL | 5 refills | Status: DC | PRN

## 2018-10-07 NOTE — Progress Notes (Signed)
Subjective:   Diana Whitaker is a 38 y.o. G22P0 Caucasian female here for a routine well-woman exam.  No LMP recorded. (Menstrual status: Oral contraceptives).    Current complaints: none except post concussion syndrome. Does desires referral to dermatology PCP: Lorayne Bender       does desire labs  Social History: Sexual: heterosexual Marital Status: married Living situation: with family Occupation: Clinical biochemist Tobacco/alcohol: no tobacco use Illicit drugs: no history of illicit drug use  The following portions of the patient's history were reviewed and updated as appropriate: allergies, current medications, past family history, past medical history, past social history, past surgical history and problem list.  Past Medical History Past Medical History:  Diagnosis Date  . Arthritis   . Migraines   . Migraines 1999    Past Surgical History History reviewed. No pertinent surgical history.  Gynecologic History G1P0  No LMP recorded. (Menstrual status: Oral contraceptives). Contraception: OCP (estrogen/progesterone) Last Pap: 2018. Results were: normal Last mammogram: 2017. Results were: normal   Obstetric History OB History  Gravida Para Term Preterm AB Living  1            SAB TAB Ectopic Multiple Live Births               # Outcome Date GA Lbr Len/2nd Weight Sex Delivery Anes PTL Lv  1 Gravida 2014    M Vag-Spont       Current Medications Current Outpatient Medications on File Prior to Visit  Medication Sig Dispense Refill  . baclofen (LIORESAL) 10 MG tablet Take 1 tablet (10 mg total) by mouth 3 (three) times daily. 90 tablet 1  . Levonorgestrel-Ethinyl Estradiol (AMETHIA,CAMRESE) 0.15-0.03 &0.01 MG tablet Take 1 tablet by mouth daily. 91 tablet 4  . rizatriptan (MAXALT) 10 MG tablet TAKE 1 TABLET (10 MG TOTAL) BY MOUTH AS NEEDED FOR MIGRAINE. MAY REPEAT IN 2 HOURS IF NEEDED 10 tablet 5  . diazepam (VALIUM) 2 MG tablet Take 1 tablet (2 mg total) by mouth every 8  (eight) hours as needed for muscle spasms. 10 tablet 0  . ketorolac (TORADOL) 10 MG tablet Take 1 tablet (10 mg total) by mouth every 8 (eight) hours. 15 tablet 0  . meloxicam (MOBIC) 15 MG tablet Take 1 tablet (15 mg total) by mouth daily. 30 tablet 2  . metoCLOPramide (REGLAN) 10 MG tablet Take 1 tablet (10 mg total) by mouth every 8 (eight) hours as needed for up to 7 days for nausea or vomiting. 21 tablet 0  . ondansetron (ZOFRAN ODT) 4 MG disintegrating tablet Take 1 tablet (4 mg total) by mouth every 8 (eight) hours as needed for nausea or vomiting. (Patient not taking: Reported on 10/07/2018) 20 tablet 0   No current facility-administered medications on file prior to visit.     Review of Systems Patient denies any headaches, blurred vision, shortness of breath, chest pain, abdominal pain, problems with bowel movements, urination, or intercourse.  Objective:  BP 111/71   Pulse 60   Ht 5\' 3"  (1.6 m)   Wt 163 lb 1.6 oz (74 kg)   BMI 28.89 kg/m  Physical Exam  General:  Well developed, well nourished, no acute distress. She is alert and oriented x3. Skin:  Warm and dry Neck:  Midline trachea, no thyromegaly or nodules Cardiovascular: Regular rate and rhythm, no murmur heard Lungs:  Effort normal, all lung fields clear to auscultation bilaterally Breasts:  No dominant palpable mass, retraction, or nipple discharge Abdomen:  Soft, non tender, no hepatosplenomegaly or masses Pelvic:  External genitalia is normal in appearance.  The vagina is normal in appearance. The cervix is bulbous, no CMT.  Thin prep pap is not done . Uterus is felt to be normal size, shape, and contour.  No adnexal masses or tenderness noted. Extremities:  No swelling or varicosities noted Psych:  She has a normal mood and affect  Assessment:   Healthy well-woman exam Needs flu vaccine Plan:  Flu vaccine given F/U 1 year for AE, or sooner if needed Mammogram ordered   Rockney Ghee, CNM

## 2018-10-07 NOTE — Patient Instructions (Signed)
 Preventive Care 21-39 Years Old, Female Preventive care refers to visits with your health care provider and lifestyle choices that can promote health and wellness. This includes:  A yearly physical exam. This may also be called an annual well check.  Regular dental visits and eye exams.  Immunizations.  Screening for certain conditions.  Healthy lifestyle choices, such as eating a healthy diet, getting regular exercise, not using drugs or products that contain nicotine and tobacco, and limiting alcohol use. What can I expect for my preventive care visit? Physical exam Your health care provider will check your:  Height and weight. This may be used to calculate body mass index (BMI), which tells if you are at a healthy weight.  Heart rate and blood pressure.  Skin for abnormal spots. Counseling Your health care provider may ask you questions about your:  Alcohol, tobacco, and drug use.  Emotional well-being.  Home and relationship well-being.  Sexual activity.  Eating habits.  Work and work environment.  Method of birth control.  Menstrual cycle.  Pregnancy history. What immunizations do I need?  Influenza (flu) vaccine  This is recommended every year. Tetanus, diphtheria, and pertussis (Tdap) vaccine  You may need a Td booster every 10 years. Varicella (chickenpox) vaccine  You may need this if you have not been vaccinated. Human papillomavirus (HPV) vaccine  If recommended by your health care provider, you may need three doses over 6 months. Measles, mumps, and rubella (MMR) vaccine  You may need at least one dose of MMR. You may also need a second dose. Meningococcal conjugate (MenACWY) vaccine  One dose is recommended if you are age 19-21 years and a first-year college student living in a residence hall, or if you have one of several medical conditions. You may also need additional booster doses. Pneumococcal conjugate (PCV13) vaccine  You may need  this if you have certain conditions and were not previously vaccinated. Pneumococcal polysaccharide (PPSV23) vaccine  You may need one or two doses if you smoke cigarettes or if you have certain conditions. Hepatitis A vaccine  You may need this if you have certain conditions or if you travel or work in places where you may be exposed to hepatitis A. Hepatitis B vaccine  You may need this if you have certain conditions or if you travel or work in places where you may be exposed to hepatitis B. Haemophilus influenzae type b (Hib) vaccine  You may need this if you have certain conditions. You may receive vaccines as individual doses or as more than one vaccine together in one shot (combination vaccines). Talk with your health care provider about the risks and benefits of combination vaccines. What tests do I need?  Blood tests  Lipid and cholesterol levels. These may be checked every 5 years starting at age 20.  Hepatitis C test.  Hepatitis B test. Screening  Diabetes screening. This is done by checking your blood sugar (glucose) after you have not eaten for a while (fasting).  Sexually transmitted disease (STD) testing.  BRCA-related cancer screening. This may be done if you have a family history of breast, ovarian, tubal, or peritoneal cancers.  Pelvic exam and Pap test. This may be done every 3 years starting at age 21. Starting at age 30, this may be done every 5 years if you have a Pap test in combination with an HPV test. Talk with your health care provider about your test results, treatment options, and if necessary, the need for more   tests. Follow these instructions at home: Eating and drinking   Eat a diet that includes fresh fruits and vegetables, whole grains, lean protein, and low-fat dairy.  Take vitamin and mineral supplements as recommended by your health care provider.  Do not drink alcohol if: ? Your health care provider tells you not to drink. ? You are  pregnant, may be pregnant, or are planning to become pregnant.  If you drink alcohol: ? Limit how much you have to 0-1 drink a day. ? Be aware of how much alcohol is in your drink. In the [place] one drink equals one 12 oz bottle of beer (355 mL), one 5 oz glass of wine (148 mL), or one 1 oz glass of hard liquor (44 mL). Lifestyle  Take daily care of your teeth and gums.  Stay active. Exercise for at least 30 minutes on 5 or more days each week.  Do not use any products that contain nicotine or tobacco, such as cigarettes, e-cigarettes, and chewing tobacco. If you need help quitting, ask your health care provider.  If you are sexually active, practice safe sex. Use a condom or other form of birth control (contraception) in order to prevent pregnancy and STIs (sexually transmitted infections). If you plan to become pregnant, see your health care provider for a preconception visit. What's next?  Visit your health care provider once a year for a well check visit.  Ask your health care provider how often you should have your eyes and teeth checked.  Stay up to date on all vaccines. This information is not intended to replace advice given to you by your health care provider. Make sure you discuss any questions you have with your health care provider. Document Released: 03/05/2001 Document Revised: 09/18/2017 Document Reviewed: 09/18/2017 Elsevier Patient Education  2020 [employer].

## 2018-10-08 LAB — COMPREHENSIVE METABOLIC PANEL
ALT: 9 IU/L (ref 0–32)
AST: 12 IU/L (ref 0–40)
Albumin/Globulin Ratio: 1.8 (ref 1.2–2.2)
Albumin: 4.2 g/dL (ref 3.8–4.8)
Alkaline Phosphatase: 74 IU/L (ref 39–117)
BUN/Creatinine Ratio: 11 (ref 9–23)
BUN: 9 mg/dL (ref 6–24)
Bilirubin Total: 0.4 mg/dL (ref 0.0–1.2)
CO2: 20 mmol/L (ref 20–29)
Calcium: 9 mg/dL (ref 8.7–10.2)
Chloride: 107 mmol/L — ABNORMAL HIGH (ref 96–106)
Creatinine, Ser: 0.79 mg/dL (ref 0.57–1.00)
GFR calc Af Amer: 108 mL/min/{1.73_m2} (ref >59)
GFR calc non Af Amer: 94 mL/min/{1.73_m2} (ref >59)
Globulin, Total: 2.3 g/dL (ref 1.5–4.5)
Glucose: 77 mg/dL (ref 65–99)
Potassium: 4.1 mmol/L (ref 3.5–5.2)
Sodium: 144 mmol/L (ref 134–144)
Total Protein: 6.5 g/dL (ref 6.0–8.5)

## 2018-10-08 LAB — LIPID PANEL
Chol/HDL Ratio: 3.9 ratio (ref 0.0–4.4)
Cholesterol, Total: 126 mg/dL (ref 100–199)
HDL: 32 mg/dL — ABNORMAL LOW (ref >39)
LDL Chol Calc (NIH): 77 mg/dL (ref 0–99)
Triglycerides: 90 mg/dL (ref 0–149)
VLDL Cholesterol Cal: 17 mg/dL (ref 5–40)

## 2018-10-08 LAB — HEMOGLOBIN A1C
Est. average glucose Bld gHb Est-mCnc: 97 mg/dL
Hgb A1c MFr Bld: 5 % (ref 4.8–5.6)

## 2018-10-08 LAB — TSH: TSH: 1.77 u[IU]/mL (ref 0.450–4.500)

## 2018-10-14 ENCOUNTER — Encounter: Payer: Self-pay | Admitting: Family Medicine

## 2018-10-15 ENCOUNTER — Ambulatory Visit
Admission: RE | Admit: 2018-10-15 | Discharge: 2018-10-15 | Disposition: A | Discharge status: Home / Self Care | Payer: Managed Care, Other (non HMO) | Source: Ambulatory Visit | Attending: Obstetrics and Gynecology | Admitting: Obstetrics and Gynecology

## 2018-10-15 DIAGNOSIS — Z1231 Encounter for screening mammogram for malignant neoplasm of breast: Secondary | ICD-10-CM | POA: Diagnosis present

## 2018-10-15 DIAGNOSIS — Z01419 Encounter for gynecological examination (general) (routine) without abnormal findings: Secondary | ICD-10-CM

## 2018-11-14 ENCOUNTER — Other Ambulatory Visit: Payer: Self-pay | Admitting: Obstetrics and Gynecology

## 2018-11-26 ENCOUNTER — Encounter: Payer: Managed Care, Other (non HMO) | Admitting: Obstetrics and Gynecology

## 2019-02-02 ENCOUNTER — Encounter: Payer: Self-pay | Admitting: Family Medicine

## 2019-02-10 ENCOUNTER — Other Ambulatory Visit: Payer: Self-pay | Admitting: Family Medicine

## 2019-02-10 MED ORDER — RIZATRIPTAN BENZOATE 10 MG PO TABS: 10.0000 mg | ORAL_TABLET | ORAL | 5 refills | Status: DC | PRN

## 2019-02-10 NOTE — Telephone Encounter (Signed)
Patient called to verify her pharmacy.  She uses [facility].

## 2019-04-05 ENCOUNTER — Other Ambulatory Visit: Payer: Self-pay

## 2019-04-05 ENCOUNTER — Encounter: Payer: Self-pay | Admitting: Family Medicine

## 2019-04-05 ENCOUNTER — Ambulatory Visit: Payer: Managed Care, Other (non HMO) | Admitting: Family Medicine

## 2019-04-05 VITALS — BP 112/78 | HR 61 | Temp 98.3°F | Ht 63.0 in | Wt 165.8 lb

## 2019-04-05 DIAGNOSIS — E663 Overweight: Secondary | ICD-10-CM

## 2019-04-05 DIAGNOSIS — R591 Generalized enlarged lymph nodes: Secondary | ICD-10-CM

## 2019-04-05 DIAGNOSIS — R5383 Other fatigue: Secondary | ICD-10-CM

## 2019-04-05 LAB — CBC WITH DIFFERENTIAL/PLATELET
Basophils Absolute: 0 10*3/uL (ref 0.0–0.1)
Basophils Relative: 0.4 % (ref 0.0–3.0)
Eosinophils Absolute: 0.1 10*3/uL (ref 0.0–0.7)
Eosinophils Relative: 1.4 % (ref 0.0–5.0)
HCT: 39.8 % (ref 36.0–46.0)
Hemoglobin: 13.7 g/dL (ref 12.0–15.0)
Lymphocytes Relative: 41.5 % (ref 12.0–46.0)
Lymphs Abs: 3.4 10*3/uL (ref 0.7–4.0)
MCHC: 34.4 g/dL (ref 30.0–36.0)
MCV: 91.9 fl (ref 78.0–100.0)
Monocytes Absolute: 0.6 10*3/uL (ref 0.1–1.0)
Monocytes Relative: 7 % (ref 3.0–12.0)
Neutro Abs: 4.1 10*3/uL (ref 1.4–7.7)
Neutrophils Relative %: 49.7 % (ref 43.0–77.0)
Platelets: 336 10*3/uL (ref 150.0–400.0)
RBC: 4.33 Mil/uL (ref 3.87–5.11)
RDW: 12.8 % (ref 11.5–15.5)
WBC: 8.3 10*3/uL (ref 4.0–10.5)

## 2019-04-05 LAB — COMPREHENSIVE METABOLIC PANEL
ALT: 11 U/L (ref 0–35)
AST: 16 U/L (ref 0–37)
Albumin: 4 g/dL (ref 3.5–5.2)
Alkaline Phosphatase: 60 U/L (ref 39–117)
BUN: 10 mg/dL (ref 6–23)
CO2: 25 mEq/L (ref 19–32)
Calcium: 9.1 mg/dL (ref 8.4–10.5)
Chloride: 105 mEq/L (ref 96–112)
Creatinine, Ser: 0.63 mg/dL (ref 0.40–1.20)
GFR: 104.06 mL/min (ref >60.00)
Glucose, Bld: 83 mg/dL (ref 70–99)
Potassium: 3.9 mEq/L (ref 3.5–5.1)
Sodium: 138 mEq/L (ref 135–145)
Total Bilirubin: 0.3 mg/dL (ref 0.2–1.2)
Total Protein: 7.3 g/dL (ref 6.0–8.3)

## 2019-04-05 LAB — TSH: TSH: 2.4 u[IU]/mL (ref 0.35–4.50)

## 2019-04-05 LAB — VITAMIN D 25 HYDROXY (VIT D DEFICIENCY, FRACTURES): VITD: 40.79 ng/mL (ref 30.00–100.00)

## 2019-04-05 NOTE — Patient Instructions (Signed)
A resource that I like is www.dietdoctor.com/diabetes/diet  Here are some guidelines to help you with meal planning -  Avoid all processed and packaged foods (bread, pasta, crackers, chips, etc) and beverages containing calories.  Avoid added sugars and excessive natural sugars.  Attention to how you feel if you consume artificial sweeteners.  Do they make you more hungry or raise your blood sugar?  With every meal and snack, aim to get 20 g of protein (3 ounces of meat, 4 ounces of fish, 3 eggs, protein powder, 1 cup [place] yogurt, 1 cup cottage cheese, etc.)  Increase fiber in the form of non-starchy vegetables.  These help you feel full with very little carbohydrates and are good for gut health.  Eat 1 serving healthy carb per meal- 1/2 cup brown rice, beans, potato, corn- pay attention to whether or not this significantly raises your blood sugar. If it does, reduce the frequency you consume these.   Eat 2-3 servings of lower sugar fruits daily.  This includes berries, apples, oranges, peaches, pears, one half banana.  Have small amounts of good fats such as avocado, nuts, olive oil, nut butters, olives.  Add a little cheese to your salads to make them tasty.
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
## 2019-04-06 LAB — HIV ANTIBODY (ROUTINE TESTING W REFLEX): HIV 1&2 Ab, 4th Generation: NONREACTIVE

## 2019-04-06 LAB — RPR: RPR Ser Ql: NONREACTIVE

## 2019-05-26 ENCOUNTER — Encounter: Payer: Self-pay | Admitting: Family Medicine

## 2019-05-26 ENCOUNTER — Ambulatory Visit: Payer: Managed Care, Other (non HMO) | Admitting: Family Medicine

## 2019-05-26 ENCOUNTER — Other Ambulatory Visit: Payer: Self-pay

## 2019-05-26 VITALS — BP 118/62 | HR 59 | Temp 97.8°F | Wt 168.0 lb

## 2019-05-26 DIAGNOSIS — R0789 Other chest pain: Secondary | ICD-10-CM | POA: Diagnosis not present

## 2019-05-26 DIAGNOSIS — R591 Generalized enlarged lymph nodes: Secondary | ICD-10-CM | POA: Diagnosis not present

## 2019-05-26 DIAGNOSIS — M542 Cervicalgia: Secondary | ICD-10-CM

## 2019-05-26 NOTE — Patient Instructions (Signed)
Good to see today  I have put in an order for ultrasound, you will get a call to schedule  Try ibuprofen 2 tablets (400 mg) twice a day for 3-5 days to see if improvement?   Can also add your PT neck exercises, heat/ ice as needed
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
## 2019-06-04 ENCOUNTER — Encounter: Payer: Self-pay | Admitting: Family Medicine

## 2019-06-07 ENCOUNTER — Ambulatory Visit: Payer: Managed Care, Other (non HMO)

## 2019-08-02 ENCOUNTER — Other Ambulatory Visit: Payer: Self-pay | Admitting: Family Medicine

## 2019-08-02 ENCOUNTER — Encounter: Payer: Self-pay | Admitting: Family Medicine

## 2019-08-02 NOTE — Telephone Encounter (Signed)
Last refilled 02/10/2019 #10 x 5 refills Last OV 05/26/2019  Please advise, thanks.

## 2019-08-11 ENCOUNTER — Ambulatory Visit: Payer: Managed Care, Other (non HMO) | Admitting: Family Medicine

## 2019-08-12 DIAGNOSIS — M25511 Pain in right shoulder: Secondary | ICD-10-CM | POA: Insufficient documentation

## 2019-08-12 DIAGNOSIS — M542 Cervicalgia: Secondary | ICD-10-CM | POA: Insufficient documentation

## 2019-08-12 DIAGNOSIS — M7541 Impingement syndrome of right shoulder: Secondary | ICD-10-CM | POA: Insufficient documentation

## 2019-08-23 ENCOUNTER — Other Ambulatory Visit: Payer: Self-pay

## 2019-08-23 ENCOUNTER — Ambulatory Visit: Payer: Managed Care, Other (non HMO) | Admitting: Family Medicine

## 2019-08-23 ENCOUNTER — Encounter: Payer: Self-pay | Admitting: Family Medicine

## 2019-08-23 VITALS — BP 116/80 | HR 58 | Temp 97.8°F | Ht 63.0 in | Wt 167.5 lb

## 2019-08-23 DIAGNOSIS — F419 Anxiety disorder, unspecified: Secondary | ICD-10-CM

## 2019-08-23 DIAGNOSIS — G43839 Menstrual migraine, intractable, without status migrainosus: Secondary | ICD-10-CM | POA: Diagnosis not present

## 2019-08-23 MED ORDER — SERTRALINE HCL 50 MG PO TABS: 50.0000 mg | ORAL_TABLET | Freq: Every day | ORAL | 3 refills | Status: DC

## 2019-08-23 MED ORDER — RIZATRIPTAN BENZOATE 10 MG PO TABS: ORAL_TABLET | ORAL | 1 refills | Status: DC

## 2019-08-23 NOTE — Patient Instructions (Signed)
Good to see you today Schedule a follow up in 8 weeks, can be virtual  For your headaches- increase your water intake to 80 ounces a day. If you get a migraine, drink a liter of water or liquid with electrolytes. At headache onset, take maxalt and either 2 ibuprofen or 2 Alleve.   I have sent in sertraline for your anxiety. Take 1/2 tablet once a day for 3-5 days then increase to 1 tablet daily. If it makes you sleepy, move to night time.   Medication for depression and anxiety often takes 6-8 weeks to have a noticeable difference so stick with it. Also the best way for recovery is taking medication and seeing a therapist -- this is so important.      How to help anxiety and depression   1) Regular Exercise - walking, jogging, cycling, dancing, strength training - aiming for 150 minutes of exercise a week -- Yoga has been shown in research to reduce depression and anxiety -- with even just one hour long session per week -- Walk leisurely for 30 minutes every day  2)  Begin a Mindfulness/Meditation practice -- this can take a little as 3 minutes and is helpful for all kinds of mood issues -- You can find resources in books -- Or you can download apps like  -- Headspace App  -- Calm  -- Insignt Timer -- Stop, Breathe & Think   # With each of these Apps - you should decline the "start free trial" offer and as you search through the App should be able to access some of their free content. You can also chose to pay for the content if you find one that works well for you.    # Many of them also offer sleep specific content which may help with insomnia   3) Healthy Diet - Avoid fast foods and processed foods, eat mostly lean proteins, vegetables, fruits and whole grains -- Avoid or decrease Caffeine -- Avoid or decrease Alcohol -- Drink plenty of water, have a balanced diet -- Avoid cigarettes and marijuana (as well as other recreational drugs)   4) Consider contacting a professional  therapist  - [place] [phone]

## 2019-08-23 NOTE — Progress Notes (Signed)
Subjective:    Patient ID: Diana Whitaker, female    DOB: [birth year], 39 y.o.   MRN: [id number]  HPI Chief Complaint  Patient presents with  . Medication Refill   This is a 39 yo female who presents today for follow up of headaches. Has had more anxiety over the last year. Getting worse, brother died unexpectedly May 13, 2016. Has had several loses in the last year.  She worked as a Audiological scientist for 20 years.  She has lost several coworkers to to suicide in the last year.  Her husband works as a Airline pilot, she worries about him while he is working.  Her son will be going into first grade in the fall and she worries about him.  Not sleeping well, taking benadryl, some relief with this.  She is part of a support group at work and recently attended a conference since geared toward first responders which she found very helpful.  She has EAP available to her at work as well as Secretary/administrator for counselors.  She feels like she has buried her emotions for a long time and now they are coming to the surface.  She denies any depression.  Reports that she feels generally happy, just always anxious.  Migraines monthly and worse every 3 months when expecting period. Migraines lasts 5-6 days, takes Maxalt every 12 hours for several days. Migraines start in her neck and runs up side of head. Some relief with muscle relaxer. Will take maxalt, benadryl and ibuprofen with good relief. Doesn't usually wake up with them. Can not determine food triggers. Has done headache journal in past. Usually drinks 1-2 bottles of water a day.      Review of Systems Per HPI    Objective:   Physical Exam Vitals reviewed.  Constitutional:      General: She is not in acute distress.    Appearance: Normal appearance. She is normal weight. She is not ill-appearing, toxic-appearing or diaphoretic.  HENT:     Head: Normocephalic and atraumatic.  Eyes:     Conjunctiva/sclera: Conjunctivae normal.  Cardiovascular:      Rate and Rhythm: Normal rate.  Pulmonary:     Effort: Pulmonary effort is normal.  Skin:    General: Skin is warm and dry.  Neurological:     Mental Status: She is alert and oriented to person, place, and time.  Psychiatric:        Mood and Affect: Mood normal.        Behavior: Behavior normal.        Thought Content: Thought content normal.        Judgment: Judgment normal.     Comments: Occasionally tearful.          BP 116/80   Pulse (!) 58   Temp 97.8 F (36.6 C) (Temporal)   Ht 5\' 3"  (1.6 m)   Wt 167 lb 8 oz (76 kg)   SpO2 100%   BMI 29.67 kg/m  Wt Readings from Last 3 Encounters:  08/23/19 167 lb 8 oz (76 kg)  05/26/19 168 lb (76.2 kg)  04/05/19 165 lb 12.8 oz (75.2 kg)   GAD 7 : Generalized Anxiety Score 08/23/2019  Nervous, Anxious, on Edge 2  Control/stop worrying 3  Worry too much - different things 3  Trouble relaxing 2  Restless 1  Easily annoyed or irritable 0  Afraid - awful might happen 3  Total GAD 7 Score 14  Anxiety Difficulty Somewhat difficult  Depression screen [facility] 2/9 08/23/2019 06/25/2017  Decreased Interest 0 0  Down, Depressed, Hopeless 0 0  PHQ - 2 Score 0 0     Assessment & Plan:  1. Intractable menstrual migraine without status migrainosus -Provided written and verbal information regarding migraines.  We will not add preventative at this time but encouraged her to significantly increase her water intake and provided recommendations regarding medication at headache onset.  See AVS for details. -follow up in 8 weeks - rizatriptan (MAXALT) 10 MG tablet; TAKE 1 TABLET BY MOUTH AS NEEDED FOR MIGRAINE. MAY REPEAT IN 2 HOURS IF NEEDED  Dispense: 10 tablet; Refill: 1  2. Anxiety -Discussed starting medication and patient is agreeable.  Also emphasized the importance of seeking counseling and she is agreeable. -Provided written information regarding nonmedication interventions for anxiety -Follow-up in 8 weeks, sooner if any worsening of  symptoms or problems with medication - sertraline (ZOLOFT) 50 MG tablet; Take 1 tablet (50 mg total) by mouth daily.  Dispense: 30 tablet; Refill: 3  This visit occurred during the SARS-CoV-2 public health emergency.  Safety protocols were in place, including screening questions prior to the visit, additional usage of staff PPE, and extensive cleaning of exam room while observing appropriate contact time as indicated for disinfecting solutions.    Clarene Reamer, FNP-BC   Primary Care at [facility]  08/23/2019 1:21 PM

## 2019-09-16 ENCOUNTER — Other Ambulatory Visit: Payer: Self-pay | Admitting: Family Medicine

## 2019-09-16 DIAGNOSIS — F419 Anxiety disorder, unspecified: Secondary | ICD-10-CM

## 2019-10-08 ENCOUNTER — Other Ambulatory Visit: Payer: Self-pay

## 2019-10-08 ENCOUNTER — Ambulatory Visit (INDEPENDENT_AMBULATORY_CARE_PROVIDER_SITE_OTHER): Payer: Managed Care, Other (non HMO) | Admitting: Family Medicine

## 2019-10-08 ENCOUNTER — Encounter: Payer: Self-pay | Admitting: Family Medicine

## 2019-10-08 VITALS — BP 116/72 | HR 74 | Temp 97.9°F | Ht 64.0 in | Wt 163.2 lb

## 2019-10-08 DIAGNOSIS — Z0000 Encounter for general adult medical examination without abnormal findings: Secondary | ICD-10-CM | POA: Diagnosis not present

## 2019-10-08 DIAGNOSIS — F419 Anxiety disorder, unspecified: Secondary | ICD-10-CM | POA: Diagnosis not present

## 2019-10-08 DIAGNOSIS — B001 Herpesviral vesicular dermatitis: Secondary | ICD-10-CM

## 2019-10-08 DIAGNOSIS — G43839 Menstrual migraine, intractable, without status migrainosus: Secondary | ICD-10-CM | POA: Diagnosis not present

## 2019-10-08 DIAGNOSIS — Z3041 Encounter for surveillance of contraceptive pills: Secondary | ICD-10-CM

## 2019-10-08 MED ORDER — SERTRALINE HCL 50 MG PO TABS: 50.0000 mg | ORAL_TABLET | Freq: Every day | ORAL | 3 refills | Status: DC

## 2019-10-08 MED ORDER — VALACYCLOVIR HCL 1 G PO TABS: 1000.0000 mg | ORAL_TABLET | Freq: Two times a day (BID) | ORAL | 1 refills | Status: DC | PRN

## 2019-10-08 MED ORDER — RIZATRIPTAN BENZOATE 10 MG PO TABS: ORAL_TABLET | ORAL | 1 refills | Status: DC

## 2019-10-08 MED ORDER — LEVONORGEST-ETH ESTRAD 91-DAY 0.15-0.03 &0.01 MG PO TABS: 1.0000 | ORAL_TABLET | Freq: Every day | ORAL | 4 refills | Status: DC

## 2019-10-08 NOTE — Progress Notes (Signed)
Subjective:    Patient ID: Diana Whitaker, female    DOB: 19-Dec-1980, 39 y.o.   MRN: [id number]  HPI Chief Complaint  Patient presents with  . Annual Exam    wants pap smear test   This is a 39 yo female who presents today for CPE.   Last CPE-  Mammo- 10/15/2018 Pap- 08/30/2015, negative, negative Tdap- thinks she had around time of son's birth Flu- annual Covid 19 vaccine- fully vaccinated Eye- overdue, wears glasses at night Dental- regular Exercise- not regular    Review of Systems  Constitutional: Positive for appetite change (slightly decreased on sertraline). Negative for fatigue, fever and unexpected weight change.  HENT: Negative.   Eyes: Negative.   Respiratory: Negative.   Cardiovascular: Negative.   Gastrointestinal: Positive for constipation (related to menses, does not require medication, good water intake).  Endocrine: Negative.   Genitourinary: Negative.  Menstrual problem: regular, light menses.  Musculoskeletal:       Right shoulder improved with injection, PT.   Skin:       Occasional oral cold sore, requests refill of valacyclovir.   Neurological: Positive for headaches (significantly decreased with improved anxiety).  Hematological: Negative.   Psychiatric/Behavioral: Negative.        Objective:   Physical Exam Physical Exam  Constitutional: She is oriented to person, place, and time. She appears well-developed and well-nourished. No distress.  HENT:  Head: Normocephalic and atraumatic.  Right Ear: External ear normal. TM normal.  Left Ear: External ear normal. TM normal.  Nose: Nose normal.  Mouth/Throat: Oropharynx is clear and moist. No oropharyngeal exudate.  Eyes: Conjunctivae are normal.   Neck: Normal range of motion. Neck supple. No JVD present. No thyromegaly present.  Cardiovascular: Normal rate, regular rhythm, normal heart sounds and intact distal pulses.   Pulmonary/Chest: Effort normal and breath sounds normal. Right breast  exhibits no inverted nipple, no mass, no nipple discharge, no skin change and no tenderness. Left breast exhibits no inverted nipple, no mass, no nipple discharge, no skin change and no tenderness. Breasts are symmetrical.  Abdominal: Soft. Bowel sounds are normal. She exhibits no distension and no mass. There is no tenderness. There is no rebound and no guarding.  Musculoskeletal: Normal range of motion. She exhibits no edema or tenderness.  Lymphadenopathy:    She has no cervical adenopathy.  Neurological: She is alert and oriented to person, place, and time.   Skin: Skin is warm and dry. She is not diaphoretic.  Psychiatric: She has a normal mood and affect. Her behavior is normal. Judgment and thought content normal.  Vitals reviewed.     BP 116/72   Pulse 74   Temp 97.9 F (36.6 C) (Temporal)   Ht 5\' 4"  (1.626 m)   Wt 163 lb 4 oz (74 kg)   LMP  (LMP Unknown)   SpO2 98%   BMI 28.02 kg/m  Wt Readings from Last 3 Encounters:  10/08/19 163 lb 4 oz (74 kg)  08/23/19 167 lb 8 oz (76 kg)  05/26/19 168 lb (76.2 kg)   Depression screen [facility] 2/9 10/08/2019 08/23/2019 06/25/2017  Decreased Interest 0 0 0  Down, Depressed, Hopeless 0 0 0  PHQ - 2 Score 0 0 0       Assessment & Plan:  1. Annual physical exam - Discussed and encouraged healthy lifestyle choices- adequate sleep, regular exercise, stress management and healthy food choices.  - labs up to date - she will send me copy  of Tdap documentation - flu through work or Management consultant  - she will schedule mammogram  2. Anxiety - doing well on low dose sertraline - sertraline (ZOLOFT) 50 MG tablet; Take 1 tablet (50 mg total) by mouth daily.  Dispense: 90 tablet; Refill: 3  3. Intractable menstrual migraine without status migrainosus - improved - rizatriptan (MAXALT) 10 MG tablet; TAKE 1 TABLET BY MOUTH AS NEEDED FOR MIGRAINE. MAY REPEAT IN 2 HOURS IF NEEDED  Dispense: 10 tablet; Refill: 1  4. Cold sore - valACYclovir  (VALTREX) 1000 MG tablet; Take 1 tablet (1,000 mg total) by mouth 2 (two) times daily as needed.  Dispense: 8 tablet; Refill: 1  5. Uses oral contraception - Levonorgestrel-Ethinyl Estradiol (AMETHIA) 0.15-0.03 &0.01 MG tablet; Take 1 tablet by mouth daily.  Dispense: 91 tablet; Refill: 4  This visit occurred during the SARS-CoV-2 public health emergency.  Safety protocols were in place, including screening questions prior to the visit, additional usage of staff PPE, and extensive cleaning of exam room while observing appropriate contact time as indicated for disinfecting solutions.    Clarene Reamer, FNP-BC  [facility] [place] [facility]  10/08/2019 12:45 PM

## 2019-10-08 NOTE — Patient Instructions (Addendum)
Please call and schedule an appointment for screening mammogram. A referral is not needed.  [place]  Check your records for Tetanus/ Tdap  Good to see you today

## 2019-10-21 ENCOUNTER — Other Ambulatory Visit: Payer: Self-pay | Admitting: Family Medicine

## 2019-10-21 DIAGNOSIS — Z1231 Encounter for screening mammogram for malignant neoplasm of breast: Secondary | ICD-10-CM

## 2019-10-25 ENCOUNTER — Ambulatory Visit
Admission: RE | Admit: 2019-10-25 | Discharge: 2019-10-25 | Disposition: A | Discharge status: Home / Self Care | Payer: Managed Care, Other (non HMO) | Source: Ambulatory Visit | Attending: Family Medicine | Admitting: Family Medicine

## 2019-10-25 ENCOUNTER — Other Ambulatory Visit: Payer: Self-pay

## 2019-10-25 DIAGNOSIS — Z1231 Encounter for screening mammogram for malignant neoplasm of breast: Secondary | ICD-10-CM | POA: Insufficient documentation

## 2019-10-27 ENCOUNTER — Encounter: Payer: Self-pay | Admitting: Family Medicine
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
## 2019-12-30 ENCOUNTER — Telehealth: Payer: Self-pay

## 2019-12-30 NOTE — Telephone Encounter (Signed)
mychart message sent to patient

## 2020-01-04 ENCOUNTER — Other Ambulatory Visit: Payer: Self-pay | Admitting: Family Medicine

## 2020-01-04 DIAGNOSIS — G43839 Menstrual migraine, intractable, without status migrainosus: Secondary | ICD-10-CM

## 2020-01-04 NOTE — Telephone Encounter (Signed)
Pharmacy requests refill on: Rizatriptan 10 mg   LAST REFILL: 10/08/2019 LAST OV: 10/08/2019 NEXT OV: Not Scheduled  PHARMACY: [id number] [place], [place]

## 2020-02-01 ENCOUNTER — Telehealth: Payer: Self-pay | Admitting: Family Medicine

## 2020-02-01 DIAGNOSIS — G43839 Menstrual migraine, intractable, without status migrainosus: Secondary | ICD-10-CM

## 2020-02-01 MED ORDER — RIZATRIPTAN BENZOATE 10 MG PO TABS: ORAL_TABLET | ORAL | 3 refills | Status: DC

## 2020-02-01 NOTE — Telephone Encounter (Signed)
Pt is requesting a refill on her Rizatriptan to be [facility] to [facility] [place]. She is scheduled to transfer care in march

## 2020-02-15 DIAGNOSIS — F419 Anxiety disorder, unspecified: Secondary | ICD-10-CM | POA: Insufficient documentation

## 2020-02-15 DIAGNOSIS — B001 Herpesviral vesicular dermatitis: Secondary | ICD-10-CM | POA: Insufficient documentation

## 2020-02-15 DIAGNOSIS — G43111 Migraine with aura, intractable, with status migrainosus: Secondary | ICD-10-CM | POA: Insufficient documentation

## 2020-04-04 ENCOUNTER — Encounter: Payer: Self-pay | Admitting: Dermatology

## 2020-04-10 DIAGNOSIS — N6310 Unspecified lump in the right breast, unspecified quadrant: Secondary | ICD-10-CM | POA: Insufficient documentation

## 2020-04-10 DIAGNOSIS — R599 Enlarged lymph nodes, unspecified: Secondary | ICD-10-CM | POA: Insufficient documentation

## 2020-04-10 DIAGNOSIS — N644 Mastodynia: Secondary | ICD-10-CM | POA: Insufficient documentation

## 2020-04-11 ENCOUNTER — Other Ambulatory Visit: Payer: Self-pay | Admitting: Student

## 2020-04-11 DIAGNOSIS — R1031 Right lower quadrant pain: Secondary | ICD-10-CM

## 2020-04-11 DIAGNOSIS — R599 Enlarged lymph nodes, unspecified: Secondary | ICD-10-CM

## 2020-04-12 ENCOUNTER — Encounter: Payer: Managed Care, Other (non HMO) | Admitting: Family Medicine

## 2020-05-01 ENCOUNTER — Ambulatory Visit: Payer: Managed Care, Other (non HMO)

## 2020-05-25 ENCOUNTER — Emergency Department: Payer: Managed Care, Other (non HMO)

## 2020-05-25 ENCOUNTER — Other Ambulatory Visit: Payer: Self-pay

## 2020-05-25 ENCOUNTER — Emergency Department
Admission: EM | Admit: 2020-05-25 | Discharge: 2020-05-26 | Disposition: A | Discharge status: Home / Self Care | Payer: Managed Care, Other (non HMO) | Attending: Emergency Medicine | Admitting: Emergency Medicine

## 2020-05-25 DIAGNOSIS — K5730 Diverticulosis of large intestine without perforation or abscess without bleeding: Secondary | ICD-10-CM | POA: Insufficient documentation

## 2020-05-25 DIAGNOSIS — K449 Diaphragmatic hernia without obstruction or gangrene: Secondary | ICD-10-CM | POA: Diagnosis not present

## 2020-05-25 DIAGNOSIS — R599 Enlarged lymph nodes, unspecified: Secondary | ICD-10-CM | POA: Insufficient documentation

## 2020-05-25 DIAGNOSIS — D259 Leiomyoma of uterus, unspecified: Secondary | ICD-10-CM | POA: Diagnosis not present

## 2020-05-25 DIAGNOSIS — N898 Other specified noninflammatory disorders of vagina: Secondary | ICD-10-CM | POA: Insufficient documentation

## 2020-05-25 DIAGNOSIS — R102 Pelvic and perineal pain: Secondary | ICD-10-CM

## 2020-05-25 DIAGNOSIS — R1031 Right lower quadrant pain: Secondary | ICD-10-CM | POA: Diagnosis not present

## 2020-05-25 DIAGNOSIS — R109 Unspecified abdominal pain: Secondary | ICD-10-CM

## 2020-05-25 DIAGNOSIS — Z79899 Other long term (current) drug therapy: Secondary | ICD-10-CM | POA: Diagnosis not present

## 2020-05-25 LAB — COMPREHENSIVE METABOLIC PANEL
ALT: 15 U/L (ref 0–44)
AST: 20 U/L (ref 15–41)
Albumin: 3.9 g/dL (ref 3.5–5.0)
Alkaline Phosphatase: 58 U/L (ref 38–126)
Anion gap: 9 (ref 5–15)
BUN: 11 mg/dL (ref 6–20)
CO2: 22 mmol/L (ref 22–32)
Calcium: 8.8 mg/dL — ABNORMAL LOW (ref 8.9–10.3)
Chloride: 107 mmol/L (ref 98–111)
Creatinine, Ser: 0.7 mg/dL (ref 0.44–1.00)
GFR, Estimated: >60 mL/min (ref >60)
Glucose, Bld: 141 mg/dL — ABNORMAL HIGH (ref 70–99)
Potassium: 3.6 mmol/L (ref 3.5–5.1)
Sodium: 138 mmol/L (ref 135–145)
Total Bilirubin: 0.5 mg/dL (ref 0.3–1.2)
Total Protein: 7.1 g/dL (ref 6.5–8.1)

## 2020-05-25 LAB — URINALYSIS, COMPLETE (UACMP) WITH MICROSCOPIC
Bacteria, UA: NONE SEEN
Bilirubin Urine: NEGATIVE
Glucose, UA: NEGATIVE mg/dL
Ketones, ur: NEGATIVE mg/dL
Nitrite: NEGATIVE
Protein, ur: NEGATIVE mg/dL
Specific Gravity, Urine: 1.026 (ref 1.005–1.030)
pH: 6 (ref 5.0–8.0)

## 2020-05-25 LAB — CBC
HCT: 39.4 % (ref 36.0–46.0)
Hemoglobin: 13.4 g/dL (ref 12.0–15.0)
MCH: 30.9 pg (ref 26.0–34.0)
MCHC: 34 g/dL (ref 30.0–36.0)
MCV: 91 fL (ref 80.0–100.0)
Platelets: 352 10*3/uL (ref 150–400)
RBC: 4.33 MIL/uL (ref 3.87–5.11)
RDW: 11.8 % (ref 11.5–15.5)
WBC: 10.3 10*3/uL (ref 4.0–10.5)
nRBC: 0 % (ref 0.0–0.2)

## 2020-05-25 LAB — POC URINE PREG, ED
Preg Test, Ur: NEGATIVE
Preg Test, Ur: NEGATIVE

## 2020-05-25 LAB — LIPASE, BLOOD: Lipase: 35 U/L (ref 11–51)

## 2020-05-25 MED ORDER — IOHEXOL 300 MG/ML  SOLN
100.0000 mL | Freq: Once | INTRAMUSCULAR | Status: AC | PRN
  Administered 2020-05-25: 100 mL via INTRAVENOUS

## 2020-05-25 MED ORDER — ONDANSETRON 4 MG PO TBDP
4.0000 mg | ORAL_TABLET | Freq: Once | ORAL | Status: AC | PRN
  Administered 2020-05-25: 4 mg via ORAL
  Filled 2020-05-25: qty 1

## 2020-05-25 NOTE — ED Provider Notes (Signed)
[facility] Emergency Department Provider Note    ____________________________________________   I have reviewed the triage vital signs and the nursing notes.   HISTORY  Chief Complaint Abdominal Pain   History limited by: Not Limited   HPI Diana Whitaker is a 40 y.o. female who presents to the emergency department today because of concern for right lower quadrant, right groin pain. The patient states that she has been having issues with her right lower quadrant/groin for some time.  However for the past 3 days the pain has been significantly worse.  She states that the pain does not necessarily depend on what she is doing.  Will be severe.  The patient has not noticed any change to urination or defecation.  She states that her primary care has ordered an ultrasound which showed some prominent lymph nodes.  Additionally she has concerns for right neck fullness and lymph nodes.  Denies any significant discomfort there.  Patient denies any recent fevers.  Records reviewed. Per medical record review patient has a history of right groin pain and enlarged lymph nodes that she has been seen by PCP for.   Past Medical History:  Diagnosis Date  . Arthritis   . Migraines   . Migraines 1999    Patient Active Problem List   Diagnosis Date Noted  . Cervical spine pain 08/12/2019  . Pain in joint of right shoulder 08/12/2019  . Impingement syndrome of right shoulder region 08/12/2019  . Brachial plexopathy 01/05/2018  . Concussion 01/05/2018  . Contusion of right hip region 04/18/2017  . Low back strain 04/18/2017  . Lumbar pain 03/31/2017  . Chronic constipation 08/30/2015  . Migraines 08/30/2015  . Overweight (BMI 25.0-29.9) 08/30/2015  . PALPITATIONS 08/26/2007    History reviewed. No pertinent surgical history.  Prior to Admission medications   Medication Sig Start Date End Date Taking? Authorizing Provider  diclofenac sodium (VOLTAREN) 1 % GEL 2  Gram(s) Topical 4 Times Daily PRN 11/02/18   [provider]  Levonorgestrel-Ethinyl Estradiol (AMETHIA) 0.15-0.03 &0.01 MG tablet Take 1 tablet by mouth daily. 10/08/19   Elby Beck, FNP  rizatriptan (MAXALT) 10 MG tablet May repeat in 2 hours if needed 02/01/20   Tower, Wynelle Fanny, MD  sertraline (ZOLOFT) 50 MG tablet Take 1 tablet (50 mg total) by mouth daily. 10/08/19   Elby Beck, FNP  valACYclovir (VALTREX) 1000 MG tablet Take 1 tablet (1,000 mg total) by mouth 2 (two) times daily as needed. 10/08/19   Elby Beck, FNP    Allergies Patient has no known allergies.  Family History  Adopted: Yes    Social History Social History   Tobacco Use  . Smoking status: Never Smoker  . Smokeless tobacco: Never Used  Vaping Use  . Vaping Use: Never used  Substance Use Topics  . Alcohol use: No  . Drug use: No    Review of Systems Constitutional: No fever/chills Eyes: No visual changes. ENT: No sore throat. Cardiovascular: Denies chest pain. Respiratory: Denies shortness of breath. Gastrointestinal: Positive for right lower quadrant/groin pain. Genitourinary: Negative for dysuria. Musculoskeletal: Negative for back pain. Skin: Negative for rash. Neurological: Negative for headaches, focal weakness or numbness.  ____________________________________________   PHYSICAL EXAM:  VITAL SIGNS: ED Triage Vitals  Enc Vitals Group     BP 05/25/20 1637 (!) 143/102     Pulse Rate 05/25/20 1637 (!) 106     Resp 05/25/20 1637 20     Temp 05/25/20  1637 98.9 F (37.2 C)     Temp Source 05/25/20 1637 Oral     SpO2 05/25/20 1637 98 %     Weight 05/25/20 1637 170 lb (77.1 kg)     Height 05/25/20 1637 5\' 4"  (1.626 m)     Head Circumference --      Peak Flow --      Pain Score 05/25/20 1650 10   Constitutional: Alert and oriented.  Eyes: Conjunctivae are normal.  ENT      Head: Normocephalic and atraumatic.      Nose: No congestion/rhinnorhea.       Mouth/Throat: Mucous membranes are moist.      Neck: No stridor. Cardiovascular: Normal rate, regular rhythm.  No murmurs, rubs, or gallops.  Respiratory: Normal respiratory effort without tachypnea nor retractions. Breath sounds are clear and equal bilaterally. No wheezes/rales/rhonchi. Gastrointestinal: Soft and tender to palpation in the right lower quadrant Genitourinary: Deferred Musculoskeletal: Normal range of motion in all extremities. No lower extremity edema. Neurologic:  Normal speech and language. No gross focal neurologic deficits are appreciated.  Skin:  Skin is warm, dry and intact. No rash noted. Psychiatric: Mood and affect are normal. Speech and behavior are normal. Patient exhibits appropriate insight and judgment.  ____________________________________________    LABS (pertinent positives/negatives)  CMP wnl except glu 141, ca 8.8 Upreg negative Lipase 35 UA hazy, moderate hgb, moderate leukocytes, 11-20 wbc and rbc, 11-20 squamous epi CBC wbc 10.3, hgb 13.4, plt 352  ____________________________________________   EKG  None  ____________________________________________    RADIOLOGY  CT soft tissue neck No acute abnormality  CT abd/pel Small hiatal hernia. No acute abnormality.  ____________________________________________   PROCEDURES  Procedures  ____________________________________________   INITIAL IMPRESSION / ASSESSMENT AND PLAN / ED COURSE  Pertinent labs & imaging results that were available during my care of the patient were reviewed by me and considered in my medical decision making (see chart for details).   Patient presented to the emergency department today because of concerns for right groin pain as well as concerns for fullness to her neck.  On exam she is tender in the right lower quadrant.  CT scans were obtained which did not show any acute abnormality of the neck or abdomen.  Will check an ultrasound. Did offer to give pain  medication however she declined at this time.   ____________________________________________   FINAL CLINICAL IMPRESSION(S) / ED DIAGNOSES  Right groin pain  Note: This dictation was prepared with Dragon dictation. Any transcriptional errors that result from this process are unintentional     Nance Pear, MD 05/25/20 2250

## 2020-05-25 NOTE — ED Triage Notes (Signed)
Pt guarding RLQ/ right groin area and pt states RLQ pain. Pain x 3 days that has been worse but has been off and on for 3 months. Pt was seen out pt and sent to ED for stat CT. Pt states that sometimes right leg goes numb when driving or sitting too long.

## 2020-05-26 LAB — WET PREP, GENITAL
Clue Cells Wet Prep HPF POC: NONE SEEN
Sperm: NONE SEEN
Trich, Wet Prep: NONE SEEN
Yeast Wet Prep HPF POC: NONE SEEN

## 2020-05-26 LAB — CHLAMYDIA/NGC RT PCR (ARMC ONLY)
Chlamydia Tr: NOT DETECTED
N gonorrhoeae: NOT DETECTED

## 2020-05-26 NOTE — ED Provider Notes (Signed)
  Physical Exam  BP 94/64   Pulse 70   Temp 98.9 F (37.2 C) (Oral)   Resp 16   Ht 5\' 4"  (1.626 m)   Wt 77.1 kg   LMP 05/17/2020 (Approximate)   SpO2 100%   BMI 29.18 kg/m   Physical Exam  ED Course/Procedures     Procedures  MDM  12:00 AM  Assumed care.  Patient is a 40 year old female who presented to the emergency department with right-sided abdominal pain x 1 month.  Labs, urine, CT scan reassuring.  Pelvic ultrasound pending for further evaluation.   1:10 AM  TVUS shows normal blood flow to both ovaries with no adnexal abnormality.  She has an anterior fundal fibroid.  She does have heterogeneous thickening and calcification of the endometrium and endocervical canal with small amount of trace fluid.  Radiologist states could reflect benign calcification although recommend pelvic exam and gynecologic outpatient follow-up.  Discussed these findings with patient.  We will give her outpatient OB/GYN as she states she does not have one for follow up.  1:45 AM  Pt has moderate amount of thin white vaginal discharge on pelvic exam.  No bleeding.  Cervix appears normal and is not friable.  No external vaginal lesions.  Wet prep pending.  GC and chlamydia pending.  Given outpatient OB follow-up given ultrasound findings.   2:22 AM  Pt's wet prep unremarkable.  GC and chlamydia pending.  Will contact patient if results are positive.  Given outpatient OB follow-up.  Recommended alternating Tylenol, Motrin over-the-counter as needed for pain.   At this time, I do not feel there is any life-threatening condition present. I have reviewed, interpreted and discussed all results (EKG, imaging, lab, urine as appropriate) and exam findings with patient/family. I have reviewed nursing notes and appropriate previous records.  I feel the patient is safe to be discharged home without further emergent workup and can continue workup as an outpatient as needed. Discussed usual and customary return  precautions. Patient/family verbalize understanding and are comfortable with this plan.  Outpatient follow-up has been provided as needed. All questions have been answered.      Dallon Dacosta, Delice Bison, DO 05/26/20 0222

## 2020-05-26 NOTE — ED Notes (Signed)
Pt lying in bed awake and alert; no acute distress.  Reports feeling shaky due to not eating for several hours-- per Dr Leonides Schanz pt ok to eat and drink; pt subsequently provided sandwich meal with coke to drink.  Pelvic exam pending-- pelvic cart set up -- Dr Leonides Schanz notified via secure chat.

## 2020-05-26 NOTE — Discharge Instructions (Addendum)
You may alternate Tylenol 1000 mg every 6 hours as needed for pain, fever and Ibuprofen 800 mg every 8 hours as needed for pain, fever.  Please take Ibuprofen with food.  Do not take more than 4000 mg of Tylenol (acetaminophen) in a 24 hour period.  Your labs, urine, pelvic swabs, CT of your abdomen and pelvis and your neck are reassuring today.  Your pelvic ultrasound showed mild thickening and calcification of the lining of your uterus and we recommend close follow-up with OB/GYN for further evaluation/management.

## 2020-05-26 NOTE — ED Notes (Signed)
Pt agreeable with d/c plan as discussed by Dr Leonides Schanz- this nurse has verbally reinforced d/c instructions and provided pt with written copy- pt acknowledges verbal understanding and denies any additional questions, concerns, needs-  Ambulatory independently at discharge with steady gait- no distress; vitals stable

## 2020-06-14 ENCOUNTER — Other Ambulatory Visit: Payer: Self-pay | Admitting: Family Medicine

## 2020-06-14 ENCOUNTER — Other Ambulatory Visit: Payer: Self-pay

## 2020-06-14 ENCOUNTER — Ambulatory Visit (INDEPENDENT_AMBULATORY_CARE_PROVIDER_SITE_OTHER): Payer: Managed Care, Other (non HMO) | Admitting: Obstetrics and Gynecology

## 2020-06-14 ENCOUNTER — Encounter: Payer: Self-pay | Admitting: Obstetrics and Gynecology

## 2020-06-14 VITALS — BP 122/76 | HR 63 | Ht 64.0 in | Wt 171.4 lb

## 2020-06-14 DIAGNOSIS — R1031 Right lower quadrant pain: Secondary | ICD-10-CM

## 2020-06-14 DIAGNOSIS — G43839 Menstrual migraine, intractable, without status migrainosus: Secondary | ICD-10-CM

## 2020-06-14 DIAGNOSIS — K4090 Unilateral inguinal hernia, without obstruction or gangrene, not specified as recurrent: Secondary | ICD-10-CM | POA: Diagnosis not present

## 2020-06-14 NOTE — Telephone Encounter (Signed)
CPE with Carlean Purl done on 10/08/19, last filled on 02/01/20 #10 tabs with 3 refills  Will route to pool for review

## 2020-06-14 NOTE — Progress Notes (Signed)
HPI:      [name] is a 40 y.o. G1P0 who LMP was Patient's last menstrual period was 05/17/2020 (approximate).  Subjective:   She presents today as a follow-up to emergency department visit for right lower quadrant pain.  She was found to have a number of minor issues on an ultrasound and multiple vaginal cultures were performed.  She also had a CT performed. Patient's pain has continued and she describes the pain as constant waxing and waning but never completely resolving. 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 She is on Seasonique having very light menses every 3 months without significant cramping. She describes a remote history of abnormal Pap smear and colposcopy.    Hx: The following portions of the patient's history were reviewed and updated as appropriate:             She  has a past medical history of Arthritis, Migraines, and Migraines (1999). She does not have any pertinent problems on file. She  has no past surgical history on file. Her family history is not on file. She was adopted. She  reports that she has never smoked. She has never used smokeless tobacco. She reports that she does not drink alcohol and does not use drugs. She has a current medication list which includes the following prescription(s): levonorgestrel-ethinyl estradiol, rizatriptan, valacyclovir, diclofenac sodium, and sertraline. She has No Known Allergies.       Review of Systems:  Review of Systems  Constitutional: Denied constitutional symptoms, night sweats, recent illness, fatigue, fever, insomnia and weight loss.  Eyes: Denied eye symptoms, eye pain, photophobia, vision change and visual disturbance.  Ears/Nose/Throat/Neck: Denied ear, nose, throat or neck symptoms, hearing loss, nasal discharge, sinus congestion and sore throat.  Cardiovascular: Denied cardiovascular symptoms, arrhythmia, chest pain/pressure, edema,  exercise intolerance, orthopnea and palpitations.  Respiratory: Denied pulmonary symptoms, asthma, pleuritic pain, productive sputum, cough, dyspnea and wheezing.  Gastrointestinal: Denied, gastro-esophageal reflux, melena, nausea and vomiting.  Genitourinary: Denied genitourinary symptoms including symptomatic vaginal discharge, pelvic relaxation issues, and urinary complaints.  Musculoskeletal: Denied musculoskeletal symptoms, stiffness, swelling, muscle weakness and myalgia.  Dermatologic: Denied dermatology symptoms, rash and scar.  Neurologic: Denied neurology symptoms, dizziness, headache, neck pain and syncope.  Psychiatric: Denied psychiatric symptoms, anxiety and depression.  Endocrine: Denied endocrine symptoms including hot flashes and night sweats.   Meds:   Current Outpatient Medications on File Prior to Visit  Medication Sig Dispense Refill  . Levonorgestrel-Ethinyl Estradiol (AMETHIA) 0.15-0.03 &0.01 MG tablet Take 1 tablet by mouth daily. 91 tablet 4  . rizatriptan (MAXALT) 10 MG tablet May repeat in 2 hours if needed 10 tablet 3  . valACYclovir (VALTREX) 1000 MG tablet Take 1 tablet (1,000 mg total) by mouth 2 (two) times daily as needed. 8 tablet 1  . diclofenac sodium (VOLTAREN) 1 % GEL 2 Gram(s) Topical 4 Times Daily PRN (Patient not taking: Reported on 06/14/2020)    . sertraline (ZOLOFT) 50 MG tablet Take 1 tablet (50 mg total) by mouth daily. (Patient not taking: Reported on 06/14/2020) 90 tablet 3   No current facility-administered medications on file prior to visit.       The pregnancy intention screening data noted above was reviewed. Potential methods of contraception were discussed. The patient elected to proceed with Oral Contraceptive.     Objective:     Vitals:   06/14/20 0853  BP: 122/76  Pulse: 63   Filed Weights   06/14/20 0853  Weight: 171 lb 6.4 oz (77.7 kg)              Abdominal examination reveals no dominant masses.  There is pain in the  right lower quadrant in the inguinal area.  Pain is worse with deeper palpation.  Pain is worse with Valsalva maneuver.  Assessment:    G1P0 Patient Active Problem List   Diagnosis Date Noted  . Cervical spine pain 08/12/2019  . Pain in joint of right shoulder 08/12/2019  . Impingement syndrome of right shoulder region 08/12/2019  . Brachial plexopathy 01/05/2018  . Concussion 01/05/2018  . Contusion of right hip region 04/18/2017  . Low back strain 04/18/2017  . Lumbar pain 03/31/2017  . Chronic constipation 08/30/2015  . Migraines 08/30/2015  . Overweight (BMI 25.0-29.9) 08/30/2015  . PALPITATIONS 08/26/2007     1. Right lower quadrant abdominal pain   2. Unilateral inguinal hernia without obstruction or gangrene, recurrence not specified     Suspect inguinal hernia based on location and patient's daily exacerbation with her work.   Plan:            1.  I discussed this in detail with her.  I have reassured her regarding her pelvic ultrasound including the presence of uterine fibroid and small calcifications.  I do not believe she has any pelvic issues based on her symptoms, her use of OCPs and very light menses without cramping and the findings of her ultrasound.  It seems likely that she has an inguinal hernia based on location and symptoms.  Referral to general surgery. Orders Orders Placed This Encounter  Procedures  . Ambulatory referral to General Surgery    No orders of the defined types were placed in this encounter.     F/U  Return in about 4 months (around 10/15/2020) for Annual Physical. I spent 31 minutes involved in the care of this patient preparing to see the patient by obtaining and reviewing her medical history (including labs, imaging tests and prior procedures), documenting clinical information in the electronic health record (EHR), counseling and coordinating care plans, writing and sending prescriptions, ordering tests or procedures and directly  communicating with the patient by discussing pertinent items from her history and physical exam as well as detailing my assessment and plan as noted above so that she has an informed understanding.  All of her questions were answered.  Finis Bud, M.D. 06/14/2020 9:27 AM

## 2020-06-21 ENCOUNTER — Encounter: Payer: Self-pay | Admitting: Surgery

## 2020-06-21 ENCOUNTER — Other Ambulatory Visit: Payer: Self-pay

## 2020-06-21 ENCOUNTER — Ambulatory Visit: Payer: Managed Care, Other (non HMO) | Admitting: Surgery

## 2020-06-21 VITALS — BP 121/76 | HR 71 | Temp 98.3°F | Ht 64.0 in | Wt 170.0 lb

## 2020-06-21 DIAGNOSIS — R1031 Right lower quadrant pain: Secondary | ICD-10-CM

## 2020-06-21 NOTE — Patient Instructions (Signed)
Our surgery scheduler will call you to look at surgery dates and to go over information. Please refer to your [facility] surgery sheet. You surgery will be scheduled with Dr Hampton Abbot at [facility].  Diagnostic Laparoscopy Diagnostic laparoscopy is a procedure to diagnose problems in the abdomen. It might be done for a variety of reasons, such as to look for scar tissue, a reason for abdominal pain, an abdominal mass or tumor, or fluid in the abdomen (ascites). This procedure may also be done to remove a tissue sample from the liver to look at under a microscope (biopsy). During the procedure, a thin, flexible tube that has a light and a camera on the end (laparoscope) is inserted through a small incision in the abdomen. The image from the camera is shown on a monitor to help the surgeon see inside the body. Tell a health care provider about:  Any allergies you have.  All medicines you are taking, including vitamins, herbs, eye drops, creams, and over-the-counter medicines.  Any problems you or family members have had with anesthetic medicines.  Any blood disorders you have.  Any surgeries you have had.  Any medical conditions you have.  Whether you are pregnant or may be pregnant. What are the risks? Generally, this is a safe procedure. However, problems may occur, including:  Infection.  Bleeding.  Allergic reactions to medicines or dyes.  Damage to abdominal structures or organs, such as the intestines, liver, stomach, or spleen. What happens before the procedure? Staying hydrated Follow instructions from your health care provider about hydration, which may include:  Up to 2 hours before the procedure - you may continue to drink clear liquids, such as water, clear fruit juice, black coffee, and plain tea.   Medicines Ask your health care provider about:  Changing or stopping your regular medicines. This is especially important if you are taking diabetes medicines or blood  thinners.  Taking medicines such as aspirin and ibuprofen. These medicines can thin your blood. Do not take these medicines unless your health care provider tells you to take them.  Taking over-the-counter medicines, vitamins, herbs, and supplements. General instructions  Ask your health care provider: ? How your surgery site will be marked. ? What steps will be taken to help prevent infection. These steps may include:  Removing hair at the surgery site.  Washing skin with a germ-killing soap.  Taking antibiotic medicine.  Plan to have a responsible adult take you home from the hospital or clinic.  Plan to have a responsible adult care for you for the time you are told after you leave the hospital or clinic. This is important. What happens during the procedure?  An IV will be inserted into one of your veins.  You will be given one or more of the following: ? A medicine to help you relax (sedative). ? A medicine to numb the area (local anesthetic). ? A medicine to make you fall asleep (general anesthetic).  A breathing tube will be placed down your throat to help you breathe during the procedure.  Your abdomen will be filled with an air-like gas so that your abdomen expands. This will give the surgeon more room to operate and will make your organs easier to see.  Many small incisions will be made in your abdomen.  A laparoscope and other surgical instruments will be inserted into your abdomen through these incisions.  A biopsy may be done. This will depend on the reason why you are having this  procedure.  The laparoscope and other instruments will be removed from your abdomen.  The air-like gas will be released from your abdomen.  Your incisions will be closed with stitches (sutures), skin glue, or surgical tapes and covered with a bandage (dressing).  Your breathing tube will be removed. The procedure may vary among health care providers and hospitals.   What happens  after the procedure?  Your blood pressure, heart rate, breathing rate, and blood oxygen level will be monitored until you leave the hospital or clinic.  If you were given a sedative during the procedure, it can affect you for several hours. Do not drive or operate machinery until your health care provider says that it is safe.  It is up to you to get the results of your procedure. Ask your health care provider, or the department that is doing the procedure, when your results will be ready. Summary  Diagnostic laparoscopy is a procedure to diagnose problems in the abdomen using a thin, flexible tube that has a light and a camera on the end (laparoscope).  Follow instructions from your health care provider about how to prepare for the procedure.  Plan to have a responsible adult care for you for the time you are told after you leave the hospital or clinic. This is important. This information is not intended to replace advice given to you by your health care provider. Make sure you discuss any questions you have with your health care provider. Document Revised: 09/03/2019 Document Reviewed: 09/03/2019 Elsevier Patient Education  2021 [employer].

## 2020-06-21 NOTE — H&P (View-Only) (Signed)
06/21/2020  Reason for Visit:  Right groin pain  Referring Provider:  Jeannie Fend, MD  History of Present Illness: Diana Whitaker is a 40 y.o. female presenting for evaluation of right groin pain.  The patient reports she's had intermittent issues with right groin pain over at least a year, but most recently over the past few months, it has become more frequent to constant and also more intense.  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  She reports right groin pain at the groin and crease with sometimes extension to the RLQ.  Denies having any pain on the left side.  She reports the pain could hit her at any particular time, and is not associated with po intake.  She does report some constipation and reports more pain when straining for a bowel movement, but the pain is strictly in the right groin area.  More aggravated with heavier activity at work.  She feels that the groin may be a bit puffier, but denies feeling a bulging sensation.  No fevers, chills, chest pain, shortness of breath.  She presented to the ED on 5/6 for her pain.  CT scan did not show any evidence of hernia.  I have personally viewed the images and do not see an obvious right groin pathology.  She does have some mildly enlarged lymph nodes, but are similar to the left side.  She has not had any prior surgeries.  She was found to have potential uterine fibroid, but Dr. Amalia Hailey evaluated her on 5/25 and did not think the source of her pain was pelvic in nature and was suspecting an inguinal hernia based on her symptoms and location of pain.  Past Medical History: Past Medical History:  Diagnosis Date  . Arthritis   . Migraines   . Migraines 1999     Past Surgical History: History reviewed. No pertinent surgical history.  Home Medications: Prior to Admission medications   Medication Sig Start Date End Date Taking? Authorizing Provider  Levonorgestrel-Ethinyl Estradiol (AMETHIA) 0.15-0.03  &0.01 MG tablet Take 1 tablet by mouth daily. 10/08/19  Yes Elby Beck, FNP  rizatriptan (MAXALT) 10 MG tablet MAY REPEAT IN 2 HOURS IF NEEDED 06/14/20  Yes Dutch Quint B, FNP  valACYclovir (VALTREX) 1000 MG tablet Take 1 tablet (1,000 mg total) by mouth 2 (two) times daily as needed. 10/08/19  Yes Elby Beck, FNP    Allergies: No Known Allergies  Social History:  reports that she has never smoked. She has never used smokeless tobacco. She reports that she does not drink alcohol and does not use drugs.   Family History: Family History  Adopted: Yes    Review of Systems: Review of Systems  Constitutional: Negative for chills and fever.  HENT: Negative for hearing loss.   Respiratory: Negative for shortness of breath.   Cardiovascular: Negative for chest pain.  Gastrointestinal: Positive for abdominal pain and constipation. Negative for diarrhea, nausea and vomiting.  Genitourinary: Negative for dysuria.  Musculoskeletal: Negative for myalgias.  Skin: Negative for rash.  Neurological: Negative for dizziness.  Psychiatric/Behavioral: Negative for depression.    Physical Exam BP 121/76   Pulse 71   Temp 98.3 F (36.8 C)   Ht 5\' 4"  (1.626 m)   Wt 170 lb (77.1 kg)   LMP 05/19/2020 (Exact Date)   SpO2 98%   BMI 29.18 kg/m  CONSTITUTIONAL: No acute distress, well nourished. HEENT:  Normocephalic, atraumatic, extraocular motion  intact. NECK: Trachea is midline, and there is no jugular venous distension.  RESPIRATORY:  Lungs are clear, and breath sounds are equal bilaterally. Normal respiratory effort without pathologic use of accessory muscles. CARDIOVASCULAR: Heart is regular without murmurs, gallops, or rubs. GI: The abdomen is soft, non-distended, with focal tenderness in the right groin area, along the inguinal canal and also below the inguinal ligament in the femoral space.  However, there's no palpable mass or bulging, but she is tender to palpation in this  area which is more aggravated when coughing or straining.  No hernia on the left groin either. MUSCULOSKELETAL:  Normal muscle strength and tone in all four extremities.  No peripheral edema or cyanosis. SKIN: Skin turgor is normal. There are no pathologic skin lesions.  NEUROLOGIC:  Motor and sensation is grossly normal.  Cranial nerves are grossly intact. PSYCH:  Alert and oriented to person, place and time. Affect is normal.  Laboratory Analysis: Labs on 05/25/20: Na 138, K 3.6, Cl 107, CO2 22, BUN 11, Cr 0.7.  LFTs within normal.  WBC 10.3, Hgb 13.4, Hct 39.4, Plt 352  Imaging: CT abdomen/pelvis 05/25/20: IMPRESSION: 1. Small hiatal hernia. 2. Colonic diverticulosis.  U/S Pelvic 05/25/20: IMPRESSION: 1. No concerning adnexal mass or evidence of ovarian torsion. 2. Anterior fundal fibroid. 3. Heterogeneous thickening and calcification of the endometrium and endocervical canal with small amount of trace fluid. Appearance is overall nonspecific and could reflect benign calcification though should warrant assessment with pelvic exam and gynecologic consultation/outpatient follow-up.   Assessment and Plan: [name] with right groin pain.  --Discussed with the patient the findings on her CT scan and ultrasound.  Although there is no obvious direct, indirect, or femoral hernia noted on her CT scan, her pain is so localized to the groin and femoral space, that there could possibly be a small hernia.  Her symptoms had abated by the time she had her CT scan, so perhaps the hernia had reduced, becoming less noticeable on CT scan.  Discussed with her that I do agree with Dr. Amalia Hailey that it would be highly unlikely that pelvic pathology would explain her pain.   --Given the location of her pain, and how her symptoms are more aggravated with activity and she feels the area is more swollen, I think it's reasonable to proceed with a diagnostic laparoscopy and possible robotic assisted right  inguinal hernia repair.  Discussed with her that we would evaluate her ovaries, uterus, and the right groin and proceed with hernia repair if there is one noted.  Discussed with her that she may have a femoral hernia, and to evaluate this we may have to dissect into her peritoneum to better evaluate.  Discussed with her the risks of bleeding, infection, injury to surrounding structures, post-op activity restrictions, pain and recovery, and that there's a possibility that we may not find the etiology of her pain.  She understands all this and is willing to proceed. --Will schedule her for 07/04/20.  No COVID-19 test needed.  Face-to-face time spent with the patient and care providers was 80 minutes, with more than 50% of the time spent counseling, educating, and coordinating care of the patient.     Melvyn Neth, [facility]

## 2020-06-21 NOTE — Progress Notes (Signed)
06/21/2020  Reason for Visit:  Right groin pain  Referring Provider:  Jeannie Fend, MD  History of Present Illness: Diana Whitaker is a 40 y.o. female presenting for evaluation of right groin pain.  The patient reports she's had intermittent issues with right groin pain over at least a year, but most recently over the past few months, it has become more frequent to constant and also more intense.  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  She reports right groin pain at the groin and crease with sometimes extension to the RLQ.  Denies having any pain on the left side.  She reports the pain could hit her at any particular time, and is not associated with po intake.  She does report some constipation and reports more pain when straining for a bowel movement, but the pain is strictly in the right groin area.  More aggravated with heavier activity at work.  She feels that the groin may be a bit puffier, but denies feeling a bulging sensation.  No fevers, chills, chest pain, shortness of breath.  She presented to the ED on 5/6 for her pain.  CT scan did not show any evidence of hernia.  I have personally viewed the images and do not see an obvious right groin pathology.  She does have some mildly enlarged lymph nodes, but are similar to the left side.  She has not had any prior surgeries.  She was found to have potential uterine fibroid, but Dr. Amalia Hailey evaluated her on 5/25 and did not think the source of her pain was pelvic in nature and was suspecting an inguinal hernia based on her symptoms and location of pain.  Past Medical History: Past Medical History:  Diagnosis Date  . Arthritis   . Migraines   . Migraines 1999     Past Surgical History: History reviewed. No pertinent surgical history.  Home Medications: Prior to Admission medications   Medication Sig Start Date End Date Taking? Authorizing Provider  Levonorgestrel-Ethinyl Estradiol (AMETHIA) 0.15-0.03  &0.01 MG tablet Take 1 tablet by mouth daily. 10/08/19  Yes Elby Beck, FNP  rizatriptan (MAXALT) 10 MG tablet MAY REPEAT IN 2 HOURS IF NEEDED 06/14/20  Yes Dutch Quint B, FNP  valACYclovir (VALTREX) 1000 MG tablet Take 1 tablet (1,000 mg total) by mouth 2 (two) times daily as needed. 10/08/19  Yes Elby Beck, FNP    Allergies: No Known Allergies  Social History:  reports that she has never smoked. She has never used smokeless tobacco. She reports that she does not drink alcohol and does not use drugs.   Family History: Family History  Adopted: Yes    Review of Systems: Review of Systems  Constitutional: Negative for chills and fever.  HENT: Negative for hearing loss.   Respiratory: Negative for shortness of breath.   Cardiovascular: Negative for chest pain.  Gastrointestinal: Positive for abdominal pain and constipation. Negative for diarrhea, nausea and vomiting.  Genitourinary: Negative for dysuria.  Musculoskeletal: Negative for myalgias.  Skin: Negative for rash.  Neurological: Negative for dizziness.  Psychiatric/Behavioral: Negative for depression.    Physical Exam BP 121/76   Pulse 71   Temp 98.3 F (36.8 C)   Ht 5\' 4"  (1.626 m)   Wt 170 lb (77.1 kg)   LMP 05/19/2020 (Exact Date)   SpO2 98%   BMI 29.18 kg/m  CONSTITUTIONAL: No acute distress, well nourished. HEENT:  Normocephalic, atraumatic, extraocular motion  intact. NECK: Trachea is midline, and there is no jugular venous distension.  RESPIRATORY:  Lungs are clear, and breath sounds are equal bilaterally. Normal respiratory effort without pathologic use of accessory muscles. CARDIOVASCULAR: Heart is regular without murmurs, gallops, or rubs. GI: The abdomen is soft, non-distended, with focal tenderness in the right groin area, along the inguinal canal and also below the inguinal ligament in the femoral space.  However, there's no palpable mass or bulging, but she is tender to palpation in this  area which is more aggravated when coughing or straining.  No hernia on the left groin either. MUSCULOSKELETAL:  Normal muscle strength and tone in all four extremities.  No peripheral edema or cyanosis. SKIN: Skin turgor is normal. There are no pathologic skin lesions.  NEUROLOGIC:  Motor and sensation is grossly normal.  Cranial nerves are grossly intact. PSYCH:  Alert and oriented to person, place and time. Affect is normal.  Laboratory Analysis: Labs on 05/25/20: Na 138, K 3.6, Cl 107, CO2 22, BUN 11, Cr 0.7.  LFTs within normal.  WBC 10.3, Hgb 13.4, Hct 39.4, Plt 352  Imaging: CT abdomen/pelvis 05/25/20: IMPRESSION: 1. Small hiatal hernia. 2. Colonic diverticulosis.  U/S Pelvic 05/25/20: IMPRESSION: 1. No concerning adnexal mass or evidence of ovarian torsion. 2. Anterior fundal fibroid. 3. Heterogeneous thickening and calcification of the endometrium and endocervical canal with small amount of trace fluid. Appearance is overall nonspecific and could reflect benign calcification though should warrant assessment with pelvic exam and gynecologic consultation/outpatient follow-up.   Assessment and Plan: [name] with right groin pain.  --Discussed with the patient the findings on her CT scan and ultrasound.  Although there is no obvious direct, indirect, or femoral hernia noted on her CT scan, her pain is so localized to the groin and femoral space, that there could possibly be a small hernia.  Her symptoms had abated by the time she had her CT scan, so perhaps the hernia had reduced, becoming less noticeable on CT scan.  Discussed with her that I do agree with Dr. Amalia Hailey that it would be highly unlikely that pelvic pathology would explain her pain.   --Given the location of her pain, and how her symptoms are more aggravated with activity and she feels the area is more swollen, I think it's reasonable to proceed with a diagnostic laparoscopy and possible robotic assisted right  inguinal hernia repair.  Discussed with her that we would evaluate her ovaries, uterus, and the right groin and proceed with hernia repair if there is one noted.  Discussed with her that she may have a femoral hernia, and to evaluate this we may have to dissect into her peritoneum to better evaluate.  Discussed with her the risks of bleeding, infection, injury to surrounding structures, post-op activity restrictions, pain and recovery, and that there's a possibility that we may not find the etiology of her pain.  She understands all this and is willing to proceed. --Will schedule her for 07/04/20.  No COVID-19 test needed.  Face-to-face time spent with the patient and care providers was 80 minutes, with more than 50% of the time spent counseling, educating, and coordinating care of the patient.     Melvyn Neth, [facility]

## 2020-06-22 ENCOUNTER — Telehealth: Payer: Self-pay | Admitting: Surgery

## 2020-06-22 NOTE — Telephone Encounter (Signed)
Updated information regarding rescheduled surgery.  Patient has been advised of Pre-Admission date/time, COVID Testing date and Surgery date.  Surgery Date: 07/18/20 Preadmission Testing Date: 07/12/20 (phone 8a-1p) Covid Testing Date: Not needed.     Patient has been made aware to call [phone], between 1-3:00pm the day before surgery, to find out what time to arrive for surgery.

## 2020-06-28 ENCOUNTER — Inpatient Hospital Stay: Admission: RE | Admit: 2020-06-28 | Payer: Managed Care, Other (non HMO) | Source: Ambulatory Visit

## 2020-07-12 ENCOUNTER — Other Ambulatory Visit
Admission: RE | Admit: 2020-07-12 | Discharge: 2020-07-12 | Disposition: A | Discharge status: Home / Self Care | Payer: Managed Care, Other (non HMO) | Source: Ambulatory Visit | Attending: Surgery | Admitting: Surgery

## 2020-07-12 ENCOUNTER — Other Ambulatory Visit: Payer: Self-pay

## 2020-07-12 NOTE — Patient Instructions (Signed)
Your procedure is scheduled [id number] July 18, 2020. Report to Day Surgery inside [facility] 2nd floor (Stop by admissions desk first before getting on  elevator) To find out your arrival time please call [phone] between 1PM - 3PM on Monday July 17, 2020.  Remember: Instructions that are not followed completely may result in serious medical risk,  up to and including death, or upon the discretion of your surgeon and anesthesiologist your  surgery may need to be rescheduled.     _X__ 1. Do not eat food after midnight the night before your procedure.                 No chewing gum or hard candies. You may drink clear liquids up to 2 hours                 before you are scheduled to arrive for your surgery- DO not drink clear                 liquids within 2 hours of the start of your surgery.                 Clear Liquids include:  water, apple juice without pulp, clear Gatorade, G2 or                  Gatorade Zero (avoid Red/Purple/Blue), Black Coffee or Tea (Do not add                 anything to coffee or tea).  __X__2.  On the morning of surgery brush your teeth with toothpaste and water, you                may rinse your mouth with mouthwash if you wish.  Do not swallow any toothpaste of mouthwash.     _X__ 3.  No Alcohol for 24 hours before or after surgery.   _X__ 4.  Do Not Smoke or use e-cigarettes For 24 Hours Prior to Your Surgery.                 Do not use any chewable tobacco products for at least 6 hours prior to                 Surgery.  _X__  5.  Do not use any recreational drugs (marijuana, cocaine, heroin, ecstasy, MDMA or other)                For at least one week prior to your surgery.  Combination of these drugs with anesthesia                May have life threatening results.  __X__ 6.  Notify your doctor if there is any change in your medical condition      (cold, fever, infections).     Do not wear jewelry, make-up, hairpins,  clips or nail polish. Do not wear lotions, powders, or perfumes. You may wear deodorant. Do not shave 48 hours prior to surgery.  Do not bring valuables to the hospital.    [facility] is not responsible for any belongings or valuables.  Contacts, dentures or bridgework may not be worn into surgery. Leave your suitcase in the car. After surgery it may be brought to your room. For patients admitted to the hospital, discharge time is determined by your treatment team.   Patients discharged the day of surgery will not be allowed to drive home.   Make arrangements  for someone to be with you for the first 24 hours of your Same Day Discharge.   __X__ Take these medicines the morning of surgery with A SIP OF WATER:    1. None  2.   3.   4.  5.  6.  ____ Fleet Enema (as directed)   __X__ Use Antibacterial Soap as directed  ____ Use Benzoyl Peroxide Gel as instructed  ____ Use inhalers on the day of surgery  ____ Stop metformin 2 days prior to surgery    ____ Take 1/2 of usual insulin dose the night before surgery. No insulin the morning          of surgery.   ____ Call your PCP, cardiologist, or Pulmonologist if taking Coumadin/Plavix/aspirin and ask when to stop before your surgery.   __X__ One Week prior to surgery- Stop Anti-inflammatories such as Ibuprofen, Aleve, Advil, Motrin, meloxicam (MOBIC), diclofenac, etodolac, ketorolac, Toradol, Daypro, piroxicam, Goody's or BC powders. OK TO USE TYLENOL IF NEEDED   __X__ Stop supplements until after surgery.    ____ Bring C-Pap to the hospital.    If you have any questions regarding your pre-procedure instructions,  Please call Pre-admit Testing at [phone]

## 2020-07-18 ENCOUNTER — Ambulatory Visit
Admission: RE | Admit: 2020-07-18 | Discharge: 2020-07-18 | Disposition: A | Discharge status: Home / Self Care | Payer: Managed Care, Other (non HMO) | Attending: Surgery | Admitting: Surgery

## 2020-07-18 ENCOUNTER — Encounter: Payer: Self-pay | Admitting: Surgery

## 2020-07-18 ENCOUNTER — Ambulatory Visit: Payer: Managed Care, Other (non HMO) | Admitting: Urgent Care

## 2020-07-18 ENCOUNTER — Other Ambulatory Visit: Payer: Self-pay

## 2020-07-18 ENCOUNTER — Ambulatory Visit: Payer: Managed Care, Other (non HMO) | Admitting: Anesthesiology

## 2020-07-18 ENCOUNTER — Encounter
Admission: RE | Disposition: A | Discharge status: Home / Self Care | Payer: Self-pay | Source: Home / Self Care | Attending: Surgery

## 2020-07-18 DIAGNOSIS — R1031 Right lower quadrant pain: Secondary | ICD-10-CM | POA: Diagnosis not present

## 2020-07-18 DIAGNOSIS — K4190 Unilateral femoral hernia, without obstruction or gangrene, not specified as recurrent: Secondary | ICD-10-CM | POA: Diagnosis present

## 2020-07-18 DIAGNOSIS — K4130 Unilateral femoral hernia, with obstruction, without gangrene, not specified as recurrent: Secondary | ICD-10-CM | POA: Diagnosis not present

## 2020-07-18 DIAGNOSIS — K429 Umbilical hernia without obstruction or gangrene: Secondary | ICD-10-CM | POA: Diagnosis not present

## 2020-07-18 HISTORY — PX: XI ROBOTIC ASSISTED INGUINAL HERNIA REPAIR WITH MESH: SHX6706

## 2020-07-18 HISTORY — PX: LAPAROSCOPY: SHX197

## 2020-07-18 HISTORY — PX: LAPAROSCOPIC INGUINAL HERNIA WITH UMBILICAL HERNIA: SHX5658

## 2020-07-18 LAB — POCT PREGNANCY, URINE: Preg Test, Ur: NEGATIVE

## 2020-07-18 SURGERY — LAPAROSCOPY, DIAGNOSTIC
Anesthesia: General | Laterality: Right

## 2020-07-18 MED ORDER — FENTANYL CITRATE (PF) 100 MCG/2ML IJ SOLN
25.0000 ug | INTRAMUSCULAR | Status: DC | PRN
  Administered 2020-07-18 (×3): 25 ug via INTRAVENOUS

## 2020-07-18 MED ORDER — CEFAZOLIN SODIUM-DEXTROSE 2-4 GM/100ML-% IV SOLN
INTRAVENOUS | Status: AC
  Filled 2020-07-18: qty 100

## 2020-07-18 MED ORDER — CHLORHEXIDINE GLUCONATE CLOTH 2 % EX PADS: 6.0000 | MEDICATED_PAD | Freq: Once | CUTANEOUS | Status: DC

## 2020-07-18 MED ORDER — BUPIVACAINE-EPINEPHRINE (PF) 0.25% -1:200000 IJ SOLN
INTRAMUSCULAR | Status: AC
  Filled 2020-07-18: qty 30

## 2020-07-18 MED ORDER — OXYCODONE HCL 5 MG PO TABS
5.0000 mg | ORAL_TABLET | Freq: Once | ORAL | Status: AC | PRN
Start: 2020-07-18 — End: 2020-07-18
  Administered 2020-07-18: 5 mg via ORAL

## 2020-07-18 MED ORDER — FENTANYL CITRATE (PF) 100 MCG/2ML IJ SOLN
INTRAMUSCULAR | Status: AC
  Filled 2020-07-18: qty 2

## 2020-07-18 MED ORDER — OXYCODONE HCL 5 MG/5ML PO SOLN: 5.0000 mg | Freq: Once | ORAL | Status: AC | PRN

## 2020-07-18 MED ORDER — ROCURONIUM BROMIDE 100 MG/10ML IV SOLN
INTRAVENOUS | Status: DC | PRN
  Administered 2020-07-18: 50 mg via INTRAVENOUS
  Administered 2020-07-18 (×2): 10 mg via INTRAVENOUS

## 2020-07-18 MED ORDER — PROPOFOL 10 MG/ML IV BOLUS
INTRAVENOUS | Status: AC
  Filled 2020-07-18: qty 20

## 2020-07-18 MED ORDER — SODIUM CHLORIDE FLUSH 0.9 % IV SOLN
INTRAVENOUS | Status: AC
  Filled 2020-07-18: qty 10

## 2020-07-18 MED ORDER — FENTANYL CITRATE (PF) 100 MCG/2ML IJ SOLN
INTRAMUSCULAR | Status: AC
  Administered 2020-07-18: 25 ug via INTRAVENOUS
  Filled 2020-07-18: qty 2

## 2020-07-18 MED ORDER — PROPOFOL 10 MG/ML IV BOLUS
INTRAVENOUS | Status: DC | PRN
  Administered 2020-07-18: 180 mg via INTRAVENOUS

## 2020-07-18 MED ORDER — GABAPENTIN 300 MG PO CAPS: 300.0000 mg | ORAL_CAPSULE | ORAL | Status: AC

## 2020-07-18 MED ORDER — KETOROLAC TROMETHAMINE 30 MG/ML IJ SOLN
INTRAMUSCULAR | Status: DC | PRN
  Administered 2020-07-18: 30 mg via INTRAVENOUS

## 2020-07-18 MED ORDER — IBUPROFEN 600 MG PO TABS
600.0000 mg | ORAL_TABLET | Freq: Three times a day (TID) | ORAL | 1 refills | Status: DC | PRN
Start: 2020-07-18 — End: 2020-09-13

## 2020-07-18 MED ORDER — PHENYLEPHRINE HCL (PRESSORS) 10 MG/ML IV SOLN
INTRAVENOUS | Status: AC
  Filled 2020-07-18: qty 1

## 2020-07-18 MED ORDER — OXYCODONE HCL 5 MG PO TABS
ORAL_TABLET | ORAL | Status: AC
  Filled 2020-07-18: qty 1

## 2020-07-18 MED ORDER — CHLORHEXIDINE GLUCONATE 0.12 % MT SOLN: 15.0000 mL | Freq: Once | OROMUCOSAL | Status: AC

## 2020-07-18 MED ORDER — LACTATED RINGERS IV SOLN: INTRAVENOUS | Status: DC

## 2020-07-18 MED ORDER — BUPIVACAINE-EPINEPHRINE 0.25% -1:200000 IJ SOLN
INTRAMUSCULAR | Status: DC | PRN
  Administered 2020-07-18: 30 mL

## 2020-07-18 MED ORDER — ORAL CARE MOUTH RINSE: 15.0000 mL | Freq: Once | OROMUCOSAL | Status: AC

## 2020-07-18 MED ORDER — SODIUM CHLORIDE 0.9 % IV SOLN
6.2500 mg | Freq: Four times a day (QID) | INTRAVENOUS | Status: DC | PRN
  Filled 2020-07-18: qty 0.25

## 2020-07-18 MED ORDER — DEXAMETHASONE SODIUM PHOSPHATE 10 MG/ML IJ SOLN
INTRAMUSCULAR | Status: DC | PRN
  Administered 2020-07-18: 8 mg via INTRAVENOUS

## 2020-07-18 MED ORDER — OXYCODONE HCL 5 MG PO TABS: 5.0000 mg | ORAL_TABLET | ORAL | 0 refills | Status: DC | PRN

## 2020-07-18 MED ORDER — ACETAMINOPHEN 500 MG PO TABS
1000.0000 mg | ORAL_TABLET | ORAL | Status: AC
Start: 2020-07-18 — End: 2020-07-18

## 2020-07-18 MED ORDER — SUGAMMADEX SODIUM 200 MG/2ML IV SOLN
INTRAVENOUS | Status: DC | PRN
  Administered 2020-07-18: 200 mg via INTRAVENOUS

## 2020-07-18 MED ORDER — ONDANSETRON HCL 4 MG/2ML IJ SOLN
INTRAMUSCULAR | Status: DC | PRN
  Administered 2020-07-18: 4 mg via INTRAVENOUS

## 2020-07-18 MED ORDER — PROPOFOL 10 MG/ML IV BOLUS
INTRAVENOUS | Status: AC
  Filled 2020-07-18: qty 60

## 2020-07-18 MED ORDER — ACETAMINOPHEN 500 MG PO TABS: 1000.0000 mg | ORAL_TABLET | Freq: Four times a day (QID) | ORAL | Status: DC | PRN

## 2020-07-18 MED ORDER — GABAPENTIN 300 MG PO CAPS
ORAL_CAPSULE | ORAL | Status: AC
  Administered 2020-07-18: 300 mg via ORAL
  Filled 2020-07-18: qty 1

## 2020-07-18 MED ORDER — LIDOCAINE HCL (CARDIAC) PF 100 MG/5ML IV SOSY
PREFILLED_SYRINGE | INTRAVENOUS | Status: DC | PRN
  Administered 2020-07-18: 80 mg via INTRAVENOUS

## 2020-07-18 MED ORDER — CHLORHEXIDINE GLUCONATE 0.12 % MT SOLN
OROMUCOSAL | Status: AC
  Administered 2020-07-18: 15 mL via OROMUCOSAL
  Filled 2020-07-18: qty 15

## 2020-07-18 MED ORDER — BUPIVACAINE LIPOSOME 1.3 % IJ SUSP
INTRAMUSCULAR | Status: AC
  Filled 2020-07-18: qty 20

## 2020-07-18 MED ORDER — MIDAZOLAM HCL 2 MG/2ML IJ SOLN
INTRAMUSCULAR | Status: DC | PRN
  Administered 2020-07-18: 2 mg via INTRAVENOUS

## 2020-07-18 MED ORDER — MIDAZOLAM HCL 2 MG/2ML IJ SOLN
INTRAMUSCULAR | Status: AC
  Filled 2020-07-18: qty 2

## 2020-07-18 MED ORDER — BUPIVACAINE LIPOSOME 1.3 % IJ SUSP: 20.0000 mL | Freq: Once | INTRAMUSCULAR | Status: DC

## 2020-07-18 MED ORDER — FENTANYL CITRATE (PF) 100 MCG/2ML IJ SOLN
INTRAMUSCULAR | Status: DC | PRN
  Administered 2020-07-18 (×4): 50 ug via INTRAVENOUS

## 2020-07-18 MED ORDER — PROMETHAZINE HCL 25 MG/ML IJ SOLN
INTRAMUSCULAR | Status: AC
  Administered 2020-07-18: 6.25 mg
  Filled 2020-07-18: qty 1

## 2020-07-18 MED ORDER — BUPIVACAINE LIPOSOME 1.3 % IJ SUSP
INTRAMUSCULAR | Status: DC | PRN
  Administered 2020-07-18: 20 mL

## 2020-07-18 MED ORDER — ACETAMINOPHEN 500 MG PO TABS
ORAL_TABLET | ORAL | Status: AC
  Administered 2020-07-18: 1000 mg via ORAL
  Filled 2020-07-18: qty 2

## 2020-07-18 MED ORDER — CEFAZOLIN SODIUM-DEXTROSE 2-4 GM/100ML-% IV SOLN
2.0000 g | INTRAVENOUS | Status: AC
  Administered 2020-07-18: 2 g via INTRAVENOUS

## 2020-07-18 MED ORDER — CHLORHEXIDINE GLUCONATE CLOTH 2 % EX PADS
6.0000 | MEDICATED_PAD | Freq: Once | CUTANEOUS | Status: AC
  Administered 2020-07-18: 6 via TOPICAL

## 2020-07-18 SURGICAL SUPPLY — 82 items
ADH SKN CLS APL DERMABOND .7 (GAUZE/BANDAGES/DRESSINGS) ×2
APL PRP STRL LF DISP 70% ISPRP (MISCELLANEOUS) ×2
BAG SPEC RTRVL LRG 6X4 10 (ENDOMECHANICALS)
BLADE SURG SZ11 CARB STEEL (BLADE) ×3 IMPLANT
CANISTER SUCT 1200ML W/VALVE (MISCELLANEOUS) ×3 IMPLANT
CANNULA REDUC XI 12-8 STAPL (CANNULA) ×1
CANNULA REDUCER 12-8 DVNC XI (CANNULA) ×2 IMPLANT
CHLORAPREP W/TINT 26 (MISCELLANEOUS) ×3 IMPLANT
COVER TIP SHEARS 8 DVNC (MISCELLANEOUS) ×2 IMPLANT
COVER TIP SHEARS 8MM DA VINCI (MISCELLANEOUS) ×1
COVER WAND RF STERILE (DRAPES) ×3 IMPLANT
CUTTER ECHEON FLEX ENDO 45 340 (ENDOMECHANICALS) IMPLANT
DEFOGGER SCOPE WARMER CLEARIFY (MISCELLANEOUS) ×3 IMPLANT
DERMABOND ADVANCED (GAUZE/BANDAGES/DRESSINGS) ×1
DERMABOND ADVANCED .7 DNX12 (GAUZE/BANDAGES/DRESSINGS) ×2 IMPLANT
DRAPE ARM DVNC X/XI (DISPOSABLE) ×8 IMPLANT
DRAPE COLUMN DVNC XI (DISPOSABLE) ×2 IMPLANT
DRAPE DA VINCI XI ARM (DISPOSABLE) ×4
DRAPE DA VINCI XI COLUMN (DISPOSABLE) ×1
ELECT CAUTERY BLADE TIP 2.5 (TIP) ×3
ELECT CAUTERY NEEDLE 2.0 MIC (NEEDLE) IMPLANT
ELECT REM PT RETURN 9FT ADLT (ELECTROSURGICAL) ×3
ELECTRODE CAUTERY BLDE TIP 2.5 (TIP) ×2 IMPLANT
ELECTRODE REM PT RTRN 9FT ADLT (ELECTROSURGICAL) ×2 IMPLANT
GAUZE 4X4 16PLY ~~LOC~~+RFID DBL (SPONGE) ×3 IMPLANT
GLOVE SURG SYN 7.0 (GLOVE) ×12 IMPLANT
GLOVE SURG SYN 7.5  E (GLOVE) ×4
GLOVE SURG SYN 7.5 E (GLOVE) ×8 IMPLANT
GOWN STRL REUS W/ TWL LRG LVL3 (GOWN DISPOSABLE) ×8 IMPLANT
GOWN STRL REUS W/TWL LRG LVL3 (GOWN DISPOSABLE) ×12
IRRIGATION STRYKERFLOW (MISCELLANEOUS) IMPLANT
IRRIGATOR STRYKERFLOW (MISCELLANEOUS)
IV NS 1000ML (IV SOLUTION) ×3
IV NS 1000ML BAXH (IV SOLUTION) ×2 IMPLANT
KIT PINK PAD W/HEAD ARE REST (MISCELLANEOUS) ×3
KIT PINK PAD W/HEAD ARM REST (MISCELLANEOUS) ×2 IMPLANT
KIT TURNOVER KIT A (KITS) ×3 IMPLANT
L-HOOK LAP DISP 36CM (ELECTROSURGICAL)
LABEL OR SOLS (LABEL) ×3 IMPLANT
LHOOK LAP DISP 36CM (ELECTROSURGICAL) IMPLANT
LIGASURE LAP MARYLAND 5MM 37CM (ELECTROSURGICAL) IMPLANT
MANIFOLD NEPTUNE II (INSTRUMENTS) ×3 IMPLANT
MESH 3DMAX 4X6 RT LRG (Mesh General) ×1 IMPLANT
MESH 3DMAX MID 4X6 RT LRG (Mesh General) ×2 IMPLANT
NEEDLE HYPO 22GX1.5 SAFETY (NEEDLE) ×3 IMPLANT
NEEDLE INSUFFLATION 14GA 120MM (NEEDLE) ×3 IMPLANT
NS IRRIG 500ML POUR BTL (IV SOLUTION) ×3 IMPLANT
OBTURATOR OPTICAL STANDARD 8MM (TROCAR) ×1
OBTURATOR OPTICAL STND 8 DVNC (TROCAR) ×2
OBTURATOR OPTICALSTD 8 DVNC (TROCAR) ×2 IMPLANT
PACK LAP CHOLECYSTECTOMY (MISCELLANEOUS) ×3 IMPLANT
PENCIL ELECTRO HAND CTR (MISCELLANEOUS) ×6 IMPLANT
POUCH SPECIMEN RETRIEVAL 10MM (ENDOMECHANICALS) IMPLANT
SCISSORS METZENBAUM CVD 33 (INSTRUMENTS) IMPLANT
SEAL CANN UNIV 5-8 DVNC XI (MISCELLANEOUS) ×6 IMPLANT
SEAL XI 5MM-8MM UNIVERSAL (MISCELLANEOUS) ×3
SET TUBE SMOKE EVAC HIGH FLOW (TUBING) ×3 IMPLANT
SLEEVE ADV FIXATION 5X100MM (TROCAR) IMPLANT
SOLUTION ELECTROLUBE (MISCELLANEOUS) ×3 IMPLANT
SPONGE T-LAP 18X18 ~~LOC~~+RFID (SPONGE) ×3 IMPLANT
STAPLE RELOAD 45 WHT (STAPLE) IMPLANT
STAPLE RELOAD 45MM BLUE (STAPLE) IMPLANT
STAPLE RELOAD 45MM WHITE (STAPLE)
STAPLER CANNULA SEAL DVNC XI (STAPLE) ×2 IMPLANT
STAPLER CANNULA SEAL XI (STAPLE) ×1
SUT MNCRL 4-0 (SUTURE) ×3
SUT MNCRL 4-0 27XMFL (SUTURE) ×2
SUT MNCRL AB 4-0 PS2 18 (SUTURE) IMPLANT
SUT PROLENE 2 0 SH DA (SUTURE) ×3 IMPLANT
SUT VIC AB 2-0 SH 27 (SUTURE) ×3
SUT VIC AB 2-0 SH 27XBRD (SUTURE) ×2 IMPLANT
SUT VIC AB 3-0 SH 27 (SUTURE) ×3
SUT VIC AB 3-0 SH 27X BRD (SUTURE) ×2 IMPLANT
SUT VICRYL 0 AB UR-6 (SUTURE) IMPLANT
SUT VLOC 90 S/L VL9 GS22 (SUTURE) ×3 IMPLANT
SUTURE MNCRL 4-0 27XMF (SUTURE) ×2 IMPLANT
SYS KII FIOS ACCESS ABD 5X100 (TROCAR)
SYSTEM KII FIOS ACES ABD 5X100 (TROCAR) IMPLANT
TAPE TRANSPORE STRL 2 31045 (GAUZE/BANDAGES/DRESSINGS) ×3 IMPLANT
TRAY FOLEY MTR SLVR 16FR STAT (SET/KITS/TRAYS/PACK) ×3 IMPLANT
TRAY FOLEY SLVR 16FR LF STAT (SET/KITS/TRAYS/PACK) IMPLANT
TROCAR BALLN GELPORT 12X130M (ENDOMECHANICALS) ×3 IMPLANT

## 2020-07-18 NOTE — Anesthesia Postprocedure Evaluation (Signed)
Anesthesia Post Note  Patient: Diana Whitaker  Procedure(s) Performed: LAPAROSCOPY DIAGNOSTIC XI ROBOTIC ASSISTED INGUINAL HERNIA REPAIR WITH MESH (Right)  Patient location during evaluation: PACU Anesthesia Type: General Level of consciousness: awake and alert Pain management: pain level controlled Vital Signs Assessment: post-procedure vital signs reviewed and stable Respiratory status: spontaneous breathing, nonlabored ventilation, respiratory function stable and patient connected to nasal cannula oxygen Cardiovascular status: blood pressure returned to baseline and stable Postop Assessment: no apparent nausea or vomiting Anesthetic complications: no   No notable events documented.   Last Vitals:  Vitals:   07/18/20 1120 07/18/20 1125  BP:  118/71  Pulse: 79 89  Resp: 11 12  Temp:    SpO2: 94% 93%    Last Pain:  Vitals:   07/18/20 1120  TempSrc:   PainSc: 2                  Precious Haws Trinita Devlin

## 2020-07-18 NOTE — Transfer of Care (Signed)
Immediate Anesthesia Transfer of Care Note  Patient: Diana Whitaker  Procedure(s) Performed: LAPAROSCOPY DIAGNOSTIC XI ROBOTIC ASSISTED INGUINAL HERNIA REPAIR WITH MESH (Right)  Patient Location: PACU  Anesthesia Type:General  Level of Consciousness: awake, drowsy and patient cooperative  Airway & Oxygen Therapy: Patient Spontanous Breathing  Post-op Assessment: Report given to RN and Post -op Vital signs reviewed and stable  Post vital signs: Reviewed and stable  Last Vitals:  Vitals Value Taken Time  BP 122/72 07/18/20 1030  Temp 36.2 C 07/18/20 1028  Pulse 100 07/18/20 1035  Resp 12 07/18/20 1035  SpO2 97 % 07/18/20 1035  Vitals shown include unvalidated device data.  Last Pain:  Vitals:   07/18/20 1028  TempSrc:   PainSc: Asleep         Complications: No notable events documented.

## 2020-07-18 NOTE — Discharge Instructions (Addendum)
AMBULATORY SURGERY  DISCHARGE INSTRUCTIONS   The drugs that you were given will stay in your system until tomorrow so for the next 24 hours you should not:  Drive an automobile Make any legal decisions Drink any alcoholic beverage   You may resume regular meals tomorrow.  Today it is better to start with liquids and gradually work up to solid foods.  You may eat anything you prefer, but it is better to start with liquids, then soup and crackers, and gradually work up to solid foods.   Please notify your doctor immediately if you have any unusual bleeding, trouble breathing, redness and pain at the surgery site, drainage, fever, or pain not relieved by medication.    Additional Instructions:  Keep green arm band on for 4 days  Please contact your physician with any problems or Same Day Surgery at [phone], Monday through Friday 6 am to 4 pm, or [place] at [facility] number at [phone].

## 2020-07-18 NOTE — Anesthesia Preprocedure Evaluation (Signed)
Anesthesia Evaluation  Patient identified by MRN, date of birth, ID band Patient awake    Reviewed: Allergy & Precautions, NPO status , Patient's Chart, lab work & pertinent test results  History of Anesthesia Complications Negative for: history of anesthetic complications  Airway Mallampati: II  TM Distance: >3 FB Neck ROM: full    Dental  (+) Chipped   Pulmonary neg pulmonary ROS, neg shortness of breath,    Pulmonary exam normal        Cardiovascular Exercise Tolerance: Good (-) angina(-) Past MI negative cardio ROS Normal cardiovascular exam     Neuro/Psych  Headaches,  Neuromuscular disease negative psych ROS   GI/Hepatic negative GI ROS, Neg liver ROS, neg GERD  ,  Endo/Other  negative endocrine ROS  Renal/GU      Musculoskeletal  (+) Arthritis ,   Abdominal   Peds  Hematology negative hematology ROS (+)   Anesthesia Other Findings Past Medical History: No date: Arthritis No date: Migraines 1999: Migraines  History reviewed. No pertinent surgical history.     Reproductive/Obstetrics negative OB ROS                             Anesthesia Physical Anesthesia Plan  ASA: 2  Anesthesia Plan: General ETT   Post-op Pain Management:    Induction: Intravenous  PONV Risk Score and Plan: Ondansetron, Dexamethasone, Midazolam and Treatment may vary due to age or medical condition  Airway Management Planned: Oral ETT  Additional Equipment:   Intra-op Plan:   Post-operative Plan: Extubation in OR  Informed Consent: I have reviewed the patients History and Physical, chart, labs and discussed the procedure including the risks, benefits and alternatives for the proposed anesthesia with the patient or authorized representative who has indicated his/her understanding and acceptance.     Dental Advisory Given  Plan Discussed with: Anesthesiologist, CRNA and  Surgeon  Anesthesia Plan Comments: (Patient consented for risks of anesthesia including but not limited to:  - adverse reactions to medications - damage to eyes, teeth, lips or other oral mucosa - nerve damage due to positioning  - sore throat or hoarseness - Damage to heart, brain, nerves, lungs, other parts of body or loss of life  Patient voiced understanding.)        Anesthesia Quick Evaluation

## 2020-07-18 NOTE — Anesthesia Procedure Notes (Signed)
Procedure Name: Intubation Date/Time: 07/18/2020 7:41 AM Performed by: Lowry Bowl, CRNA Pre-anesthesia Checklist: Patient identified, Emergency Drugs available, Suction available and Patient being monitored Patient Re-evaluated:Patient Re-evaluated prior to induction Oxygen Delivery Method: Circle system utilized Preoxygenation: Pre-oxygenation with 100% oxygen Induction Type: IV induction Ventilation: Mask ventilation without difficulty Laryngoscope Size: 3 and McGraph (Electively used videoscope) Grade View: Grade I Tube type: Oral Tube size: 7.0 mm Number of attempts: 1 Airway Equipment and Method: Stylet and Video-laryngoscopy Placement Confirmation: ETT inserted through vocal cords under direct vision, positive ETCO2 and breath sounds checked- equal and bilateral Secured at: 21 cm Tube secured with: Tape Dental Injury: Teeth and Oropharynx as per pre-operative assessment

## 2020-07-18 NOTE — Interval H&P Note (Signed)
History and Physical Interval Note:  07/18/2020 7:08 AM  Diana Whitaker  has presented today for surgery, with the diagnosis of right groin pain.  The various methods of treatment have been discussed with the patient and family. After consideration of risks, benefits and other options for treatment, the patient has consented to  Procedure(s): LAPAROSCOPY DIAGNOSTIC (N/A) XI ROBOTIC ASSISTED INGUINAL HERNIA REPAIR WITH MESH (Right) as a surgical intervention.  The patient's history has been reviewed, patient examined, no change in status, stable for surgery.  I have reviewed the patient's chart and labs.  Questions were answered to the patient's satisfaction.     Shondrika Hoque

## 2020-07-18 NOTE — Op Note (Addendum)
Procedure Date:  07/18/2020  Pre-operative Diagnosis:  Right groin pain  Post-operative Diagnosis: Right femoral hernia, umbilical hernia  Procedure: Diagnostic laparoscopy Robotic assisted right femoral Hernia Repair Open umbilical hernia repair  Surgeon:  Melvyn Neth, MD  Anesthesia:  General endotracheal  Estimated Blood Loss:  10 ml  Specimens:  None  Complications:  None  Findings:  Initial inspection of the pelvis did not reveal any pathology of the uterus or ovaries.  The patient had a small right femoral hernia containing fat.  Indications for Procedure:  [name] who presents with right groin pain, suspected to have an inguinal hernia.  The options of surgery versus observation were reviewed with the patient and/or family. The risks of bleeding, abscess or infection, recurrence of symptoms, potential for an open procedure, injury to surrounding structures, and chronic pain were all discussed with the patient and she was willing to proceed.  Description of Procedure: The patient was correctly identified in the preoperative area and brought into the operating room.  The patient was placed supine with VTE prophylaxis in place.  Appropriate time-outs were performed.  Anesthesia was induced and the patient was intubated.  Foley catheter was placed.  Appropriate antibiotics were infused.  The abdomen was prepped and draped in a sterile fashion. An infraumbilical incision was made. The patient was noted to have an umbilical hernia.  Cautery was used to dissect along the umbilical stalk and the stalk was detached from the underlying fascia using cautery.  This revealed a small hernia defect.  The fascial edges were cleared using cautery, and a 12 mm robotic port was inserted.  Pneumoperitoneum was obtained with appropriate opening pressures.  A Veress needle was used to start dissecting the peritoneal flap on the right side.  Two 8-mm robotic ports were placed in  the right and left lateral positions under direct visualization.  A large 3D Max Mid Bard Mesh, a 2-0 Vicryl, and 2-0 vloc suture were placed through the umbilical port under direct visualization.  The [employer] platform was docked onto the patient, the camera was inserted and targeted, and the instruments were placed under direct visualization.  We started with a diagnostic laparoscopy to evaluate the source of the patient's pain.  The uterus was normal in size, with no noticeable masses.  Bilateral ovaries were also of appropriate size, without cysts or masses.  Fallopian tubes were visualized without masses either.  The sigmoid colon going into the pelvis was also retracted cephalad and there was no inflammation or abscess.  There were no masses.  It was decided to proceed with right groin exploration.  Using electocautery, the peritoneal and posterior rectus tissue flap was created.  The peritoneum on the right side was scored from the median umbilical ligament laterally towards the ASIS.  The flap was mobilized using robotic scissors and the bipolar instruments, creating a plane along the posterior rectus sheath and transversalis fascia down to the pubic tubercle medially. It was then further mobilized laterally across the inguinal canal and femoral vessels and onto the psoas muscle. The inferior epigastric vessels were identified and preserved. This created a posterior rectus and peritoneal flap measuring roughly 17 cm x 12 cm.  The patient was found to have a small femoral hernia containing fat.  The hernia was reduced preserving all structures and the fat was resected.  A right large Bard 3D Max mesh was placed with good overlap along all the potential hernia defects and secured  in place with 2-0 Vicryl along the medial, superomedial and superolateral aspects.  Then, the peritoneal flap was advanced over the mesh and carried over to close the defect. A running 2-0 V lock suture was used to approximate the  edge of the flap onto the peritoneum.  All needles were removed under direct visualization.  The 8- mm ports were removed under direct visualization and the Hasson trocar was removed.  The hernia defect was closed using 0 vicryl sutures.  The underside of the umbilical stalk was repaired using a running 2-0 Prolene suture.  Local anesthetic was infused in all incisions as well as a right ilioinguinal block.  The umbilical stalk was reattached to the fascia using 2-0 Vicryl sutures.  The umbilical wound was then closed using 3-0 Vicryl and 4-0 Monocryl.  The other port incisions were closed with 4-0 Monocryl.  The wounds were cleaned and sealed with DermaBond.  Foley catheter was removed and the patient was emerged from anesthesia and extubated and brought to the recovery room for further management.  The patient tolerated the procedure well and all counts were correct at the end of the case.   Melvyn Neth, MD

## 2020-07-19 ENCOUNTER — Encounter: Payer: Self-pay | Admitting: Surgery

## 2020-07-19 ENCOUNTER — Telehealth: Payer: Self-pay | Admitting: *Deleted

## 2020-07-19 NOTE — Telephone Encounter (Signed)
Faxed FMLA to [employer]. [place] at [phone]

## 2020-07-25 ENCOUNTER — Telehealth: Payer: Self-pay | Admitting: Surgery

## 2020-07-25 NOTE — Telephone Encounter (Signed)
Patient complains of constipation-she has tried magnesium citrate and Miralax-we discussed using Fleets enema- she denies fever-her incisions are looking good and free from any drainage. She was instructed to call office if no results from the enema.

## 2020-07-25 NOTE — Telephone Encounter (Signed)
Incoming call from patient.  She had surgery on 07/18/20 inguinal hernia repair with Dr. Hampton Abbot.  She has only been able to have a bowel movement x 1 and then was very little. She is using a stool softener and drinking plenty of water.  Also is still having quite a bit of right groin pain with pressure and abdominal pain.  She has been using the Oxycodone and has been alternating with Tylenol and Ibuprofen.  No fever, chills or vomiting  but does have some nausea.  Please call her. Thank you.

## 2020-08-02 ENCOUNTER — Encounter: Payer: Self-pay | Admitting: Surgery

## 2020-08-02 ENCOUNTER — Ambulatory Visit (INDEPENDENT_AMBULATORY_CARE_PROVIDER_SITE_OTHER): Payer: Managed Care, Other (non HMO) | Admitting: Surgery

## 2020-08-02 ENCOUNTER — Other Ambulatory Visit: Payer: Self-pay

## 2020-08-02 VITALS — BP 121/83 | HR 91 | Temp 98.1°F | Ht 64.0 in | Wt 166.0 lb

## 2020-08-02 DIAGNOSIS — Z09 Encounter for follow-up examination after completed treatment for conditions other than malignant neoplasm: Secondary | ICD-10-CM

## 2020-08-02 DIAGNOSIS — R1031 Right lower quadrant pain: Secondary | ICD-10-CM

## 2020-08-02 MED ORDER — GABAPENTIN 300 MG PO CAPS: 300.0000 mg | ORAL_CAPSULE | Freq: Three times a day (TID) | ORAL | 0 refills | Status: DC

## 2020-08-02 NOTE — Patient Instructions (Addendum)
Pick up your prescription at your pharmacy.    If you have any concerns or questions, please feel free to call our office. See follow up appointment below.   GENERAL POST-OPERATIVE PATIENT INSTRUCTIONS   WOUND CARE INSTRUCTIONS:  Keep a dry clean dressing on the wound if there is drainage. The initial bandage may be removed after 24 hours.  Once the wound has quit draining you may leave it open to air.  If clothing rubs against the wound or causes irritation and the wound is not draining you may cover it with a dry dressing during the daytime.  Try to keep the wound dry and avoid ointments on the wound unless directed to do so.  If the wound becomes bright red and painful or starts to drain infected material that is not clear, please contact your physician immediately.  If the wound is mildly pink and has a thick firm ridge underneath it, this is normal, and is referred to as a healing ridge.  This will resolve over the next 4-6 weeks.  BATHING: You may shower if you have been informed of this by your surgeon. However, Please do not submerge in a tub, hot tub, or pool until incisions are completely sealed or have been told by your surgeon that you may do so.  DIET:  You may eat any foods that you can tolerate.  It is a good idea to eat a high fiber diet and take in plenty of fluids to prevent constipation.  If you do become constipated you may want to take a mild laxative or take ducolax tablets on a daily basis until your bowel habits are regular.  Constipation can be very uncomfortable, along with straining, after recent surgery.  ACTIVITY:  You are encouraged to cough and deep breath or use your incentive spirometer if you were given one, every 15-30 minutes when awake.  This will help prevent respiratory complications and low grade fevers post-operatively if you had a general anesthetic.  You may want to hug a pillow when coughing and sneezing to add additional support to the surgical area, if  you had abdominal or chest surgery, which will decrease pain during these times.  You are encouraged to walk and engage in light activity for the next two weeks.  You should not lift more than 15 pounds, until 08/29/2020 as it could put you at increased risk for complications.  Twenty pounds is roughly equivalent to a plastic bag of groceries. At that time- Listen to your body when lifting, if you have pain when lifting, stop and then try again in a few days. Soreness after doing exercises or activities of daily living is normal as you get back in to your normal routine.  MEDICATIONS:  Try to take narcotic medications and anti-inflammatory medications, such as tylenol, ibuprofen, naprosyn, etc., with food.  This will minimize stomach upset from the medication.  Should you develop nausea and vomiting from the pain medication, or develop a rash, please discontinue the medication and contact your physician.  You should not drive, make important decisions, or operate machinery when taking narcotic pain medication.  SUNBLOCK Use sun block to incision area over the next year if this area will be exposed to sun. This helps decrease scarring and will allow you avoid a permanent darkened area over your incision.

## 2020-08-02 NOTE — Progress Notes (Signed)
08/02/2020  HPI: Diana Whitaker is a 40 y.o. female s/p diagnostic laparoscopy with robotic assisted right femora hernia repair on 07/18/20.  She had issues with post-op constipation which are finally better.  She reports today that she's having a lot of soreness in the right groin and femoral area.  Denies the same type of sensation compared to before surgery, but now it's a lot of soreness.  Sometimes the soreness extends towards the upper inner thigh.  She also has soreness at the umbilical incision.  Denies any further constipation and denies any nausea or vomiting.  Vital signs: BP 121/83   Pulse 91   Temp 98.1 F (36.7 C) (Oral)   Ht 5\' 4"  (1.626 m)   Wt 166 lb (75.3 kg)   SpO2 98%   BMI 28.49 kg/m    Physical Exam: Constitutional:  No acute distress Abdomen:  soft, non-distended, with tenderness to palpation at the umbilical incision and also at the femoral hernia site.  There is an area of firmness at the level of the femoral hernia site, possibly scar tissue forming vs seroma.  No hernia palpable.  The umbilical incision is also tender but the incision is healing well without any evidence of recurrence or infection.  Assessment/Plan: [name] s/p diagnostic laparoscopy and right femoral hernia repair.  --discussed with the patient that some of her pain can be post-surgical with inflammation and irritation to the groin nerves.  There is no evidence of recurrence at this point.  Will give her a prescription for gabapentin TID to see if this helps with her pain.   --Follow up in two weeks to check her progress.  If no improvement, then will obtain ultrasound to further evaluate.   Melvyn Neth, [facility]

## 2020-08-07 ENCOUNTER — Other Ambulatory Visit: Payer: Self-pay

## 2020-08-07 ENCOUNTER — Emergency Department: Payer: Managed Care, Other (non HMO)

## 2020-08-07 ENCOUNTER — Emergency Department
Admission: EM | Admit: 2020-08-07 | Discharge: 2020-08-08 | Disposition: A | Discharge status: Home / Self Care | Payer: Managed Care, Other (non HMO) | Attending: Emergency Medicine | Admitting: Emergency Medicine

## 2020-08-07 DIAGNOSIS — L03311 Cellulitis of abdominal wall: Secondary | ICD-10-CM | POA: Diagnosis not present

## 2020-08-07 DIAGNOSIS — G8918 Other acute postprocedural pain: Secondary | ICD-10-CM | POA: Insufficient documentation

## 2020-08-07 LAB — BASIC METABOLIC PANEL
Anion gap: 4 — ABNORMAL LOW (ref 5–15)
BUN: 16 mg/dL (ref 6–20)
CO2: 27 mmol/L (ref 22–32)
Calcium: 8.7 mg/dL — ABNORMAL LOW (ref 8.9–10.3)
Chloride: 107 mmol/L (ref 98–111)
Creatinine, Ser: 0.69 mg/dL (ref 0.44–1.00)
GFR, Estimated: >60 mL/min (ref >60)
Glucose, Bld: 99 mg/dL (ref 70–99)
Potassium: 3.8 mmol/L (ref 3.5–5.1)
Sodium: 138 mmol/L (ref 135–145)

## 2020-08-07 LAB — CBC WITH DIFFERENTIAL/PLATELET
Abs Immature Granulocytes: 0.03 10*3/uL (ref 0.00–0.07)
Basophils Absolute: 0 10*3/uL (ref 0.0–0.1)
Basophils Relative: 0 %
Eosinophils Absolute: 0.2 10*3/uL (ref 0.0–0.5)
Eosinophils Relative: 2 %
HCT: 37.2 % (ref 36.0–46.0)
Hemoglobin: 12.4 g/dL (ref 12.0–15.0)
Immature Granulocytes: 0 %
Lymphocytes Relative: 46 %
Lymphs Abs: 4.5 10*3/uL — ABNORMAL HIGH (ref 0.7–4.0)
MCH: 31.2 pg (ref 26.0–34.0)
MCHC: 33.3 g/dL (ref 30.0–36.0)
MCV: 93.5 fL (ref 80.0–100.0)
Monocytes Absolute: 0.9 10*3/uL (ref 0.1–1.0)
Monocytes Relative: 9 %
Neutro Abs: 4.3 10*3/uL (ref 1.7–7.7)
Neutrophils Relative %: 43 %
Platelets: 339 10*3/uL (ref 150–400)
RBC: 3.98 MIL/uL (ref 3.87–5.11)
RDW: 11.7 % (ref 11.5–15.5)
WBC: 9.9 10*3/uL (ref 4.0–10.5)
nRBC: 0 % (ref 0.0–0.2)

## 2020-08-07 LAB — POC URINE PREG, ED: Preg Test, Ur: NEGATIVE

## 2020-08-07 MED ORDER — IOHEXOL 300 MG/ML  SOLN
75.0000 mL | Freq: Once | INTRAMUSCULAR | Status: AC | PRN
  Administered 2020-08-08: 75 mL via INTRAVENOUS

## 2020-08-07 MED ORDER — MORPHINE SULFATE (PF) 4 MG/ML IV SOLN
4.0000 mg | Freq: Once | INTRAVENOUS | Status: AC
  Administered 2020-08-07: 4 mg via INTRAVENOUS
  Filled 2020-08-07: qty 1

## 2020-08-07 NOTE — ED Provider Notes (Signed)
[facility] Emergency Department Provider Note ____________________________________________   Event Date/Time   First MD Initiated Contact with Patient 08/07/20 2222     (approximate)  I have reviewed the triage vital signs and the nursing notes.  HISTORY  Chief Complaint Post-op Problem   HPI Diana Whitaker is a 40 y.o. femalewho presents to the ED for evaluation of postoperative pain.  Chart review indicates patient had robotic assisted laparoscopy and right-sided femoral hernia repair on 6/28. Dr. Hampton Abbot.   Patient presents to the ED for evaluation of worsening pain around her periumbilical laparoscopic access site as well as associated erythematous rash to superior to her umbilicus.  She reports difficulty with control pain over the past 3 weeks since her surgery.  She reports continued generalized abdominal discomfort, around her periumbilical access site she has had increasing focal pain associated with erythematous skin discoloration over the past few days.  She denies any fevers or systemic symptoms.  Denies emesis, stool changes.  She reports that she has passed a bowel movement and denies dysuria.  Past Medical History:  Diagnosis Date   Arthritis    Migraines    Migraines 1999    Patient Active Problem List   Diagnosis Date Noted   Right groin pain    Umbilical hernia without obstruction and without gangrene    Cervical spine pain 08/12/2019   Pain in joint of right shoulder 08/12/2019   Impingement syndrome of right shoulder region 08/12/2019   Brachial plexopathy 01/05/2018   Concussion 01/05/2018   Contusion of right hip region 04/18/2017   Low back strain 04/18/2017   Lumbar pain 03/31/2017   Chronic constipation 08/30/2015   Migraines 08/30/2015   Overweight (BMI 25.0-29.9) 08/30/2015   PALPITATIONS 08/26/2007    Past Surgical History:  Procedure Laterality Date   LAPAROSCOPY N/A 07/18/2020   Procedure: LAPAROSCOPY  DIAGNOSTIC;  Surgeon: Olean Ree, MD;  Location: [facility] ORS;  Service: General;  Laterality: N/A;   XI ROBOTIC ASSISTED INGUINAL HERNIA REPAIR WITH MESH Right 07/18/2020   Procedure: XI ROBOTIC ASSISTED INGUINAL HERNIA REPAIR WITH MESH;  Surgeon: Olean Ree, MD;  Location: [facility] ORS;  Service: General;  Laterality: Right;    Prior to Admission medications   Medication Sig Start Date End Date Taking? Authorizing Provider  acetaminophen (TYLENOL) 500 MG tablet Take 2 tablets (1,000 mg total) by mouth every 6 (six) hours as needed for mild pain. 07/18/20   Olean Ree, MD  gabapentin (NEURONTIN) 300 MG capsule Take 1 capsule (300 mg total) by mouth 3 (three) times daily. 08/02/20   Olean Ree, MD  ibuprofen (ADVIL) 600 MG tablet Take 1 tablet (600 mg total) by mouth every 8 (eight) hours as needed for moderate pain. 07/18/20   Olean Ree, MD  Levonorgestrel-Ethinyl Estradiol (AMETHIA) 0.15-0.03 &0.01 MG tablet Take 1 tablet by mouth daily. 10/08/19   Elby Beck, FNP  rizatriptan (MAXALT) 10 MG tablet MAY REPEAT IN 2 HOURS IF NEEDED Patient taking differently: Take 10 mg by mouth as needed for migraine. 06/14/20   Kennyth Arnold, FNP  valACYclovir (VALTREX) 1000 MG tablet Take 1 tablet (1,000 mg total) by mouth 2 (two) times daily as needed. 10/08/19   Elby Beck, FNP    Allergies Patient has no known allergies.  Family History  Adopted: Yes    Social History Social History   Tobacco Use   Smoking status: Never   Smokeless tobacco: Never  Vaping Use   Vaping Use: Never  used  Substance Use Topics   Alcohol use: No   Drug use: No    Review of Systems  Constitutional: No fever/chills Eyes: No visual changes. ENT: No sore throat. Cardiovascular: Denies chest pain. Respiratory: Denies shortness of breath. Gastrointestinal: Positive for abdominal pain  No nausea, no vomiting.  No diarrhea.  No constipation. Genitourinary: Negative for dysuria. Musculoskeletal:  Negative for back pain. Skin: Positive for painful erythematous rash to her superior periumbilical abdomen Neurological: Negative for headaches, focal weakness or numbness.  ____________________________________________   PHYSICAL EXAM:  VITAL SIGNS: Vitals:   08/07/20 2140  BP: 136/88  Pulse: 85  Resp: 18  Temp: 98.8 F (37.1 C)  SpO2: 100%     Constitutional: Alert and oriented. Well appearing and in no acute distress. Eyes: Conjunctivae are normal. PERRL. EOMI. Head: Atraumatic. Nose: No congestion/rhinnorhea. Mouth/Throat: Mucous membranes are moist.  Oropharynx non-erythematous. Neck: No stridor. No cervical spine tenderness to palpation. Cardiovascular: Normal rate, regular rhythm. Grossly normal heart sounds.  Good peripheral circulation. Respiratory: Normal respiratory effort.  No retractions. Lungs CTAB. Gastrointestinal: Soft , nondistended. No CVA tenderness. Diffuse tenderness without peritoneal features.  Voluntary guarding throughout. Bilateral laparoscopic sites are healing well without erythema, induration, fluctuance or purulence. No dehiscence. Umbilical laparoscopic access site without dehiscence.  To superior to this is a 2 x 4 cm area of erythema that is flat with induration and tenderness to palpation focally. Musculoskeletal: No lower extremity tenderness nor edema.  No joint effusions. No signs of acute trauma. Neurologic:  Normal speech and language. No gross focal neurologic deficits are appreciated. No gait instability noted. Skin:  Skin is warm, dry and intact. No rash noted. Psychiatric: Mood and affect are normal. Speech and behavior are normal.  ____________________________________________   LABS (all labs ordered are listed, but only abnormal results are displayed)  Labs Reviewed  CBC WITH DIFFERENTIAL/PLATELET - Abnormal; Notable for the following components:      Result Value   Lymphs Abs 4.5 (*)    All other components within normal  limits  BASIC METABOLIC PANEL - Abnormal; Notable for the following components:   Calcium 8.7 (*)    Anion gap 4 (*)    All other components within normal limits  POC URINE PREG, ED   ____________________________________________  12 Lead EKG   ____________________________________________  RADIOLOGY  ED MD interpretation:    Official radiology report(s): No results found.  ____________________________________________   PROCEDURES and INTERVENTIONS  Procedure(s) performed (including Critical Care):  Procedures  Medications  morphine 4 MG/ML injection 4 mg (4 mg Intravenous Given 08/07/20 2307)    ____________________________________________   MDM / ED COURSE   40 year old female presents 3 weeks out from laparoscopic hernia repair, with evidence of cellulitis of her abdominal wall, pending CT imaging to assess for additional postoperative complications.  Normal vitals.  Blood work reassuring without significant leukocytosis.  She is tender throughout her abdomen, but she reports having poorly controlled pain for the past 3 weeks and that this is similar, but she does seem to have a focal area of increased tenderness just superior to her umbilicus with skin changes consistent with cellulitis.  Examination is difficult due to her guarding and pain.  CT abdomen/pelvis pending at time of signout to oncoming provider to follow-up any evidence of deep or postoperative complication such as seroma, hematoma, abscess to contribute to her symptoms.  If there is no significant intra-abdominal pathology, it would be reasonable to discharge her with antibiotics to treat cellulitis.  ____________________________________________   FINAL CLINICAL IMPRESSION(S) / ED DIAGNOSES  Final diagnoses:  Post-operative pain  Cellulitis of abdominal wall     ED Discharge Orders     None        Iyanna Drummer   Note:  This document was prepared using Dragon voice recognition software  and may include unintentional dictation errors.    Vladimir Crofts, MD 08/07/20 [phone]

## 2020-08-07 NOTE — ED Triage Notes (Addendum)
Pt presents to ER c/o lower abd pain.  Pt states she had hernia repair 6/28 and everything has been fine with that but recently started having pain above her umbilicus.  Pt states it feels hot sometimes and feels like there is a "knot" in the area.  Area above umbilicus appears slightly red and is tender to touch.

## 2020-08-08 ENCOUNTER — Emergency Department: Payer: Managed Care, Other (non HMO)

## 2020-08-08 MED ORDER — CEPHALEXIN 500 MG PO CAPS: 500.0000 mg | ORAL_CAPSULE | Freq: Three times a day (TID) | ORAL | 0 refills | Status: DC

## 2020-08-08 MED ORDER — ONDANSETRON 4 MG PO TBDP: 4.0000 mg | ORAL_TABLET | Freq: Three times a day (TID) | ORAL | 0 refills | Status: DC | PRN

## 2020-08-08 MED ORDER — CEPHALEXIN 500 MG PO CAPS
500.0000 mg | ORAL_CAPSULE | Freq: Once | ORAL | Status: AC
  Administered 2020-08-08: 500 mg via ORAL
  Filled 2020-08-08: qty 1

## 2020-08-08 MED ORDER — HYDROCODONE-ACETAMINOPHEN 5-325 MG PO TABS: 1.0000 | ORAL_TABLET | Freq: Four times a day (QID) | ORAL | 0 refills | Status: DC | PRN

## 2020-08-08 NOTE — ED Provider Notes (Signed)
-----------------------------------------   1:15 AM on 08/08/2020 -----------------------------------------   CT abdomen/pelvis interpreted per Dr. Collins Scotland:   Inflammatory change surrounding the umbilicus without circumscribed  fluid collection.   Updated patient on all test results.  Will place on Keflex, prescription for Norco and Zofran to use as needed.  Patient will follow up with her surgeon this week.  Strict return precautions given.  Patient verbalizes understanding agrees with plan of care.   Paulette Blanch, MD 08/08/20 [phone]

## 2020-08-08 NOTE — Discharge Instructions (Addendum)
1.  Take antibiotic as prescribed (Keflex 500 mg  3 times daily x7 days). 2.  You may take pain and nausea medicines as needed (Norco/Zofran #20). 3.  Return to the ER for worsening symptoms, persistent vomiting, fever, difficulty breathing or other concerns.

## 2020-08-09 ENCOUNTER — Ambulatory Visit (INDEPENDENT_AMBULATORY_CARE_PROVIDER_SITE_OTHER): Payer: Managed Care, Other (non HMO) | Admitting: Surgery

## 2020-08-09 ENCOUNTER — Encounter: Payer: Self-pay | Admitting: Surgery

## 2020-08-09 ENCOUNTER — Other Ambulatory Visit: Payer: Self-pay

## 2020-08-09 VITALS — BP 122/85 | HR 94 | Temp 98.2°F | Ht 64.0 in | Wt 168.0 lb

## 2020-08-09 DIAGNOSIS — T8149XA Infection following a procedure, other surgical site, initial encounter: Secondary | ICD-10-CM

## 2020-08-09 DIAGNOSIS — R1031 Right lower quadrant pain: Secondary | ICD-10-CM

## 2020-08-09 DIAGNOSIS — Z09 Encounter for follow-up examination after completed treatment for conditions other than malignant neoplasm: Secondary | ICD-10-CM

## 2020-08-09 NOTE — Progress Notes (Signed)
08/09/2020  HPI: Diana Whitaker is a 40 y.o. female s/p diagnostic laparoscopy with robotic right femoral hernia repair with open umbilical hernia repair.  She presents today for follow-up.  She went to emergency room on 7/19 where redness around her umbilical incision was diagnosed with wound infection given course of Keflex.  She does report still having some soreness in the right groin in the femoral area.  This is not worsening but also not fully improving yet.  She is taking gabapentin which has been helping.  Vital signs: BP 122/85   Pulse 94   Temp 98.2 F (36.8 C)   Ht 5\' 4"  (1.626 m)   Wt 168 lb (76.2 kg)   SpO2 97%   BMI 28.84 kg/m    Physical Exam: Constitutional: No acute distress Abdomen:  Umbilical wound infection appears to have been centered at the base of the umbilicus, where there is a small opening of the skin at the base of the umbilicus.  There's no drainage, and the erythema is improving.  Dry gauze dressing applied.  Assessment/Plan: [name] s/p robotic assisted right femoral hernia repair and open umbilical hernia repair.  --I have personally viewed CT scan that she had on 7/19 and there is no evidence of new or recurrent hernia in the femoral area, only post-op changes of some inflammation.  One potential issue causing her pain could be the inflammation alone, vs irritation of the inguinal nerves, as she also reports sometimes shooting pain down the groin to the upper thigh.   --With regards to the wound infection, will continue her Keflex until course completed.   --Follow up in 2 weeks.  If her pain in the right groin has not changed, then will do Kenalog/lidocaine injection.   Melvyn Neth, [facility]

## 2020-08-09 NOTE — Patient Instructions (Addendum)
Finish your antibiotics and continue to take the Gabapentin.  You may shower, let the warm soapy water run over the area, rinse well, and pat dry and place a dry gauze dressing over the area while it is open.   We will have you follow up here in 2 weeks.    GENERAL POST-OPERATIVE PATIENT INSTRUCTIONS   WOUND CARE INSTRUCTIONS:  If clothing rubs against the wound or causes irritation and the wound is not draining you may cover it with a dry dressing during the daytime. Try to keep the wound dry and avoid ointments on the wound unless directed to do so. If the wound becomes bright red and painful or starts to drain infected material that is not clear, please contact your physician immediately.  If the wound is mildly pink and has a thick firm ridge underneath it, this is normal, and is referred to as a healing ridge.  This will resolve over the next 4-6 weeks.  BATHING: You may shower if you have been informed of this by your surgeon. However, Please do not submerge in a tub, hot tub, or pool until incisions are completely sealed or have been told by your surgeon that you may do so.  DIET:  You may eat any foods that you can tolerate.  It is a good idea to eat a high fiber diet and take in plenty of fluids to prevent constipation.  If you do become constipated you may want to take a mild laxative or take ducolax tablets on a daily basis until your bowel habits are regular.  Constipation can be very uncomfortable, along with straining, after recent surgery.  ACTIVITY: You may want to hug a pillow when coughing and sneezing to add additional support to the surgical area, if you had abdominal or chest surgery, which will decrease pain during these times.  You are encouraged to walk and engage in light activity for the next two weeks.  You should not lift more than 20 pounds for 6 weeks after surgery as it could put you at increased risk for complications.  Twenty pounds is roughly equivalent to a plastic  bag of groceries. At that time- Listen to your body when lifting, if you have pain when lifting, stop and then try again in a few days. Soreness after doing exercises or activities of daily living is normal as you get back in to your normal routine.  MEDICATIONS:  Try to take narcotic medications and anti-inflammatory medications, such as tylenol, ibuprofen, naprosyn, etc., with food.  This will minimize stomach upset from the medication.  Should you develop nausea and vomiting from the pain medication, or develop a rash, please discontinue the medication and contact your physician.  You should not drive, make important decisions, or operate machinery when taking narcotic pain medication.  SUNBLOCK Use sun block to incision area over the next year if this area will be exposed to sun. This helps decrease scarring and will allow you avoid a permanent darkened area over your incision.  QUESTIONS:  Please feel free to call our office if you have any questions, and we will be glad to assist you.

## 2020-08-15 ENCOUNTER — Encounter: Payer: Self-pay | Admitting: Surgery

## 2020-08-16 ENCOUNTER — Other Ambulatory Visit: Payer: Self-pay

## 2020-08-16 ENCOUNTER — Telehealth: Payer: Self-pay

## 2020-08-16 MED ORDER — AMOXICILLIN-POT CLAVULANATE 875-125 MG PO TABS: 1.0000 | ORAL_TABLET | Freq: Two times a day (BID) | ORAL | 0 refills | Status: AC

## 2020-08-16 NOTE — Telephone Encounter (Signed)
Spoke with the patient and she will start the Augmentin and is scheduled to see Dr Hampton Abbot on Monday.

## 2020-08-16 NOTE — Telephone Encounter (Signed)
Per Dr Dahlia Byes Augmentin 875 mg one twice daily for 7 days has been sent into the patient's pharmacy. She also needs to be scheduled for a post op with Dr Hampton Abbot for Monday. Message left for the patient to call back about this.

## 2020-08-21 ENCOUNTER — Encounter: Payer: Self-pay | Admitting: Surgery

## 2020-08-21 ENCOUNTER — Ambulatory Visit (INDEPENDENT_AMBULATORY_CARE_PROVIDER_SITE_OTHER): Payer: Managed Care, Other (non HMO) | Admitting: Surgery

## 2020-08-21 ENCOUNTER — Other Ambulatory Visit: Payer: Self-pay

## 2020-08-21 ENCOUNTER — Encounter: Payer: Managed Care, Other (non HMO) | Admitting: Surgery

## 2020-08-21 VITALS — BP 105/72 | HR 80 | Temp 98.2°F | Ht 64.0 in | Wt 168.2 lb

## 2020-08-21 DIAGNOSIS — R1031 Right lower quadrant pain: Secondary | ICD-10-CM

## 2020-08-21 DIAGNOSIS — Z09 Encounter for follow-up examination after completed treatment for conditions other than malignant neoplasm: Secondary | ICD-10-CM

## 2020-08-21 DIAGNOSIS — T8149XA Infection following a procedure, other surgical site, initial encounter: Secondary | ICD-10-CM

## 2020-08-21 NOTE — Patient Instructions (Addendum)
We applied Silver Nitrate to the wound. You will notice a dark grayish drainage today-this is normal. We will keep you out of work for 4 additional weeks. Please see your follow up appointment listed below.

## 2020-08-21 NOTE — Progress Notes (Signed)
08/21/2020  HPI: Diana Whitaker is a 40 y.o. female s/p robotic assisted right femoral hernia repair and open umbilical hernia repair on 07/18/20.  She presents for follow up.  She was last seen on 7/20 after being in the ER for umbilical wound infection.  She called on 7/26 as she continued having redness at the umbilicus and was extended on her antibiotic course as a precaution.  Today she reports much improvement in her umbilical pain and wound.  Denies any worsening pain there or any drainage.  She also has been having issues with post-op right groin pain and in the ED, her CT scan did not reveal any seroma or hernia recurrence on the right.  She reports that the pain is also improving, with now having some days without pain, and some others with pain.  Previously, it had been constant.  She is able to ambulate, though starts feeling discomfort if ambulates for a long distance, and she's still having discomfort when she does anything strenuous or goes up/down stairs too much.  Vital signs: BP 105/72   Pulse 80   Temp 98.2 F (36.8 C) (Oral)   Ht '5\' 4"'$  (1.626 m)   Wt 168 lb 3.2 oz (76.3 kg)   SpO2 98%   BMI 28.87 kg/m    Physical Exam: Constitutional: No acute distress Abdomen:  Soft, non-distended.  Umbilical incision has small opening at the very base of the umbilicus, about 2-3 mm in size.  No drainage, and treated with silver nitrate, gauze, and tape.  The right groin still having tenderness at the site of prior femoral hernia.  No palpable recurrence or mass at this point, but exam is somewhat limited due to tenderness.  There is also some discomfort in the groin area towards the site of external inguinal ring.  Also no palpable hernia there.  Assessment/Plan: [name] s/p robotic assisted right femoral hernia repair and open umbilical hernia repair.  --The patient's umbilical wound is healing appropriately, and silver nitrate applied to help the healing process.  Once  she completes her antibiotic course, do not think she'll need further rx.   --From the groin pain standpoint, I suspect there is a component of irritation of the ilioinguinal / genitofemoral nerves as part of the surgery.  This is improving and there are days where she does not have pain.  This is reassuring and hopefully this will continue to improve rather than become a chronic pain issue.  For now, will continue to monitor this.   --Follow up in a month to reassess her progress.   Melvyn Neth, [facility]

## 2020-08-25 ENCOUNTER — Encounter: Payer: Managed Care, Other (non HMO) | Admitting: Surgery

## 2020-08-27 ENCOUNTER — Encounter: Payer: Self-pay | Admitting: Surgery

## 2020-08-28 ENCOUNTER — Telehealth: Payer: Self-pay

## 2020-08-28 ENCOUNTER — Encounter: Payer: Managed Care, Other (non HMO) | Admitting: Surgery

## 2020-08-28 NOTE — Telephone Encounter (Signed)
Spoke with pharmacy-Augmentin 875/125 mg BID x 10 days -no refills-spoke with patient also- Called to- [facility]  [place] [address]  Patient asked about submerging and was advised against it due to drainage and having an open wound. She is currently on vacation.

## 2020-09-06 ENCOUNTER — Telehealth: Payer: Self-pay | Admitting: Physician Assistant

## 2020-09-06 ENCOUNTER — Ambulatory Visit (INDEPENDENT_AMBULATORY_CARE_PROVIDER_SITE_OTHER): Payer: Managed Care, Other (non HMO) | Admitting: Physician Assistant

## 2020-09-06 ENCOUNTER — Ambulatory Visit
Admission: RE | Admit: 2020-09-06 | Discharge: 2020-09-06 | Disposition: A | Discharge status: Home / Self Care | Payer: Managed Care, Other (non HMO) | Source: Ambulatory Visit | Attending: Physician Assistant | Admitting: Physician Assistant

## 2020-09-06 ENCOUNTER — Encounter: Payer: Self-pay | Admitting: Physician Assistant

## 2020-09-06 ENCOUNTER — Other Ambulatory Visit: Payer: Self-pay

## 2020-09-06 VITALS — BP 126/87 | HR 71 | Temp 98.8°F | Ht 64.0 in | Wt 167.0 lb

## 2020-09-06 DIAGNOSIS — T8149XA Infection following a procedure, other surgical site, initial encounter: Secondary | ICD-10-CM

## 2020-09-06 DIAGNOSIS — Z09 Encounter for follow-up examination after completed treatment for conditions other than malignant neoplasm: Secondary | ICD-10-CM | POA: Insufficient documentation

## 2020-09-06 DIAGNOSIS — R1031 Right lower quadrant pain: Secondary | ICD-10-CM

## 2020-09-06 MED ORDER — IOHEXOL 350 MG/ML SOLN
75.0000 mL | Freq: Once | INTRAVENOUS | Status: AC | PRN
  Administered 2020-09-06: 75 mL via INTRAVENOUS

## 2020-09-06 NOTE — Progress Notes (Signed)
Telephone Note Reviewed patient's CT Abdomen/Pelvis (09/06/2020) with Dr Hampton Abbot. There is a small area of fluid at the umbilicus most consistent with seroma, no gross evidence of abscess. Right femoral hernia repair intact without recurrence nor inflammation. Radiologist report reviewed below:   IMPRESSION: 1. Soft tissue thickening at the umbilicus with central low-density area measuring 12 x 7 x 7 mm containing a punctate calcification. This could reflect a small seroma or phlegmon. No surrounding inflammatory change. Correlate with physical exam. 2. No acute intra-abdominal process.   Discussed patient case with Dr Hampton Abbot. We will have her return to clinic on 08/19 at 9:00 AM to discuss potential umbilical wound revision. Patient called and updated on CT findings and plan. All questions answered.   -- [name], PA-C  [facility] 09/06/2020, 2:11 PM [phone] M-F: 7am - 4pm

## 2020-09-06 NOTE — Patient Instructions (Signed)
CT scheduled now. Please go directly to Outpatient Imaging to begin your prep. [name] will call you with the results.

## 2020-09-06 NOTE — Telephone Encounter (Signed)
Error

## 2020-09-06 NOTE — Progress Notes (Signed)
[facility] POST-OP OFFICE VISIT  09/06/2020  HPI: Diana Whitaker is a 40 y.o. female ~2 months s/p diagnostic laparoscopy with robotic assisted right femora hernia repair with Dr Hampton Abbot. Her post-operative course has been complicated by an umbilical wound infection which she had been described Keflex and then Augmentin for and right groin post-operative pain. She was last seen by Dr Hampton Abbot on 08/01 for this.   Since her last visit, she continues to have drainage from her umbilicus. Her umbilical incision has healed but she continues to have drainage from a small <5 mm hole in her belly button. This occurs daily. She described this as yellow-green. She feels her belly button is sore due to this. She is on her last day of Augmentin. No documented fevers at home. She is very frustrated by this and how long it has taken her to heal.   Vital signs: BP 126/87   Pulse 71   Temp 98.8 F (37.1 C) (Oral)   Ht '5\' 4"'$  (1.626 m)   Wt 167 lb (75.8 kg)   SpO2 98%   BMI 28.67 kg/m    Physical Exam: Constitutional: Well appearing female, NAD Abdomen: Soft, she has umbilical soreness, non-distended, no rebound/guarding Skin: Her umbilical incision has healed along with her other laparoscopic incisions. She has a small <5 mm hole in her umbilicus which is draining what appears to be serous fluid. It is difficult to assess the wound bed secondary to size and she has tenderness with probing. What I can see, the wound bed appears fibrinous and I was able to express some of this with the q-tip. There is no erythema to the surrounding skin nor any gross evidence of abscess/necrosis. Fascia appears intact with probing.   Assessment/Plan: [name] ~2 months s/p diagnostic laparoscopy with robotic assisted right femora hernia repair whose post-operative course has been complicated by umbilical wound infection and right groin pain.    - Does not appear to have any gross abscess or  infection from physical examination; however, as this has been a relatively prolonged recovery and she is very frustrated, I will obtain CT Abdomen/Pelvis to ensure we are not missing anything. I do feel there is some fibrinous exudate in the wound bed and probably underlying small seroma which is draining through the skin.   - I do not think she needs any further Abx currently  - Continue superficial dressing as needed  - I will follow up with her via telephone after I receive the CT results and determine next steps in POC  -- [name], PA-C [facility] 09/06/2020, 10:36 AM [phone] M-F: 7am - 4pm

## 2020-09-08 ENCOUNTER — Encounter: Payer: Self-pay | Admitting: Surgery

## 2020-09-08 ENCOUNTER — Other Ambulatory Visit: Payer: Self-pay

## 2020-09-08 ENCOUNTER — Ambulatory Visit (INDEPENDENT_AMBULATORY_CARE_PROVIDER_SITE_OTHER): Payer: Managed Care, Other (non HMO) | Admitting: Surgery

## 2020-09-08 VITALS — BP 123/81 | HR 96 | Temp 98.2°F | Ht 64.0 in | Wt 166.0 lb

## 2020-09-08 DIAGNOSIS — T8149XA Infection following a procedure, other surgical site, initial encounter: Secondary | ICD-10-CM

## 2020-09-08 NOTE — H&P (View-Only) (Signed)
09/08/2020  History of Present Illness: Diana Whitaker is a 40 y.o. female s/p robotic right femoral hernia repair and open umbilical hernia repair on 07/18/20.  She has been having a non-healing umbilical wound and has gone through two rounds of antibiotic courses without healing.  She reports that the right groin pain is improving and she's able to do more at home.  However, the umbilical wound is not getting better and gets tender at times and drains at times.  She had a recent CT scan of abdomen and pelvis on 09/06/20 which showed no recurrence in the right femoral hernia but did show an area of soft tissue thickening at the umbilicus with a small seroma or phlegmon.  Past Medical History: Past Medical History:  Diagnosis Date   Arthritis    Migraines    Migraines 1999     Past Surgical History: Past Surgical History:  Procedure Laterality Date   LAPAROSCOPY N/A 07/18/2020   Procedure: LAPAROSCOPY DIAGNOSTIC;  Surgeon: Olean Ree, MD;  Location: [facility] ORS;  Service: General;  Laterality: N/A;   XI ROBOTIC ASSISTED INGUINAL HERNIA REPAIR WITH MESH Right 07/18/2020   Procedure: XI ROBOTIC ASSISTED INGUINAL HERNIA REPAIR WITH MESH;  Surgeon: Olean Ree, MD;  Location: [facility] ORS;  Service: General;  Laterality: Right;    Home Medications: Prior to Admission medications   Medication Sig Start Date End Date Taking? Authorizing Provider  acetaminophen (TYLENOL) 500 MG tablet Take 2 tablets (1,000 mg total) by mouth every 6 (six) hours as needed for mild pain. 07/18/20  Yes Shondra Capps, Jacqulyn Bath, MD  gabapentin (NEURONTIN) 300 MG capsule Take 1 capsule (300 mg total) by mouth 3 (three) times daily. 08/02/20  Yes Kasondra Junod, Jacqulyn Bath, MD  ibuprofen (ADVIL) 600 MG tablet Take 1 tablet (600 mg total) by mouth every 8 (eight) hours as needed for moderate pain. 07/18/20  Yes Gennell How, Jacqulyn Bath, MD  Levonorgestrel-Ethinyl Estradiol (AMETHIA) 0.15-0.03 &0.01 MG tablet Take 1 tablet by mouth daily. 10/08/19  Yes Elby Beck, FNP  ondansetron (ZOFRAN ODT) 4 MG disintegrating tablet Take 1 tablet (4 mg total) by mouth every 8 (eight) hours as needed for nausea or vomiting. 08/08/20  Yes Paulette Blanch, MD  rizatriptan (MAXALT) 10 MG tablet MAY REPEAT IN 2 HOURS IF NEEDED Patient taking differently: Take 10 mg by mouth as needed for migraine. 06/14/20  Yes Dutch Quint B, FNP  valACYclovir (VALTREX) 1000 MG tablet Take 1 tablet (1,000 mg total) by mouth 2 (two) times daily as needed. 10/08/19  Yes Elby Beck, FNP    Allergies: No Known Allergies  Review of Systems: Review of Systems  Constitutional:  Negative for chills and fever.  Respiratory:  Negative for shortness of breath.   Cardiovascular:  Negative for chest pain.  Gastrointestinal:  Positive for abdominal pain. Negative for nausea and vomiting.  Skin:        Umbilical wound with drainage   Physical Exam BP 123/81   Pulse 96   Temp 98.2 F (36.8 C)   Ht '5\' 4"'$  (1.626 m)   Wt 166 lb (75.3 kg)   LMP 08/15/2020   SpO2 97%   BMI 28.49 kg/m  CONSTITUTIONAL: No acute distress, well-nourished HEENT:  Normocephalic, atraumatic, extraocular motion intact. RESPIRATORY:  Lungs are clear, and breath sounds are equal bilaterally. Normal respiratory effort without pathologic use of accessory muscles. CARDIOVASCULAR: Heart is regular without murmurs, gallops, or rubs. GI: The abdomen is soft, nondistended, with mild tenderness to palpation at the  umbilicus.  The umbilical incision is healing well with some thinning of the skin but also the patient has a small 3 to 5 mm wound at the very base of the umbilicus which is stable.  No active purulent drainage at this time.  NEUROLOGIC:  Motor and sensation is grossly normal.  Cranial nerves are grossly intact. PSYCH:  Alert and oriented to person, place and time. Affect is normal.  Labs/Imaging: Labs from 08/07/2020: Sodium 138, potassium 3.8, chloride 107, CO2 27, BUN 16, creatinine 0.69.  WBC 9.9,  hemoglobin 12.4, hematocrit 37.2, platelet 339.  CT scan abdomen/pelvis on 09/06/2020: IMPRESSION: 1. Soft tissue thickening at the umbilicus with central low-density area measuring 12 x 7 x 7 mm containing a punctate calcification. This could reflect a small seroma or phlegmon. No surrounding inflammatory change. Correlate with physical exam. 2. No acute intra-abdominal process.  Assessment and Plan: [name] status post robotic assisted right femoral hernia repair and open umbilical hernia repair.  - The patient is now just over 7 weeks from her initial surgery.  She has had a persistent wound from the umbilicus which has not healed despite of antibiotic courses.  I think at this point this warrants further exploration in the operating room.  Discussed with her the planned surgery with exploration of umbilical wound, using the same prior incision to evaluate the deeper portions to see if there is a pocket of infection or other issues, clean the base of the umbilicus.  Reviewed the surgery at length including risks of bleeding, infection, and injury to surrounding structures, and she's willing to proceed. --Will schedule the patient for 09/13/20.   Melvyn Neth, [facility]

## 2020-09-08 NOTE — Progress Notes (Signed)
09/08/2020  History of Present Illness: Diana Whitaker is a 40 y.o. female s/p robotic right femoral hernia repair and open umbilical hernia repair on 07/18/20.  She has been having a non-healing umbilical wound and has gone through two rounds of antibiotic courses without healing.  She reports that the right groin pain is improving and she's able to do more at home.  However, the umbilical wound is not getting better and gets tender at times and drains at times.  She had a recent CT scan of abdomen and pelvis on 09/06/20 which showed no recurrence in the right femoral hernia but did show an area of soft tissue thickening at the umbilicus with a small seroma or phlegmon.  Past Medical History: Past Medical History:  Diagnosis Date   Arthritis    Migraines    Migraines 1999     Past Surgical History: Past Surgical History:  Procedure Laterality Date   LAPAROSCOPY N/A 07/18/2020   Procedure: LAPAROSCOPY DIAGNOSTIC;  Surgeon: Olean Ree, MD;  Location: [facility] ORS;  Service: General;  Laterality: N/A;   XI ROBOTIC ASSISTED INGUINAL HERNIA REPAIR WITH MESH Right 07/18/2020   Procedure: XI ROBOTIC ASSISTED INGUINAL HERNIA REPAIR WITH MESH;  Surgeon: Olean Ree, MD;  Location: [facility] ORS;  Service: General;  Laterality: Right;    Home Medications: Prior to Admission medications   Medication Sig Start Date End Date Taking? Authorizing Provider  acetaminophen (TYLENOL) 500 MG tablet Take 2 tablets (1,000 mg total) by mouth every 6 (six) hours as needed for mild pain. 07/18/20  Yes Shanedra Lave, Jacqulyn Bath, MD  gabapentin (NEURONTIN) 300 MG capsule Take 1 capsule (300 mg total) by mouth 3 (three) times daily. 08/02/20  Yes Haydyn Girvan, Jacqulyn Bath, MD  ibuprofen (ADVIL) 600 MG tablet Take 1 tablet (600 mg total) by mouth every 8 (eight) hours as needed for moderate pain. 07/18/20  Yes Marlee Armenteros, Jacqulyn Bath, MD  Levonorgestrel-Ethinyl Estradiol (AMETHIA) 0.15-0.03 &0.01 MG tablet Take 1 tablet by mouth daily. 10/08/19  Yes Elby Beck, FNP  ondansetron (ZOFRAN ODT) 4 MG disintegrating tablet Take 1 tablet (4 mg total) by mouth every 8 (eight) hours as needed for nausea or vomiting. 08/08/20  Yes Paulette Blanch, MD  rizatriptan (MAXALT) 10 MG tablet MAY REPEAT IN 2 HOURS IF NEEDED Patient taking differently: Take 10 mg by mouth as needed for migraine. 06/14/20  Yes Dutch Quint B, FNP  valACYclovir (VALTREX) 1000 MG tablet Take 1 tablet (1,000 mg total) by mouth 2 (two) times daily as needed. 10/08/19  Yes Elby Beck, FNP    Allergies: No Known Allergies  Review of Systems: Review of Systems  Constitutional:  Negative for chills and fever.  Respiratory:  Negative for shortness of breath.   Cardiovascular:  Negative for chest pain.  Gastrointestinal:  Positive for abdominal pain. Negative for nausea and vomiting.  Skin:        Umbilical wound with drainage   Physical Exam BP 123/81   Pulse 96   Temp 98.2 F (36.8 C)   Ht '5\' 4"'$  (1.626 m)   Wt 166 lb (75.3 kg)   LMP 08/15/2020   SpO2 97%   BMI 28.49 kg/m  CONSTITUTIONAL: No acute distress, well-nourished HEENT:  Normocephalic, atraumatic, extraocular motion intact. RESPIRATORY:  Lungs are clear, and breath sounds are equal bilaterally. Normal respiratory effort without pathologic use of accessory muscles. CARDIOVASCULAR: Heart is regular without murmurs, gallops, or rubs. GI: The abdomen is soft, nondistended, with mild tenderness to palpation at the  umbilicus.  The umbilical incision is healing well with some thinning of the skin but also the patient has a small 3 to 5 mm wound at the very base of the umbilicus which is stable.  No active purulent drainage at this time.  NEUROLOGIC:  Motor and sensation is grossly normal.  Cranial nerves are grossly intact. PSYCH:  Alert and oriented to person, place and time. Affect is normal.  Labs/Imaging: Labs from 08/07/2020: Sodium 138, potassium 3.8, chloride 107, CO2 27, BUN 16, creatinine 0.69.  WBC 9.9,  hemoglobin 12.4, hematocrit 37.2, platelet 339.  CT scan abdomen/pelvis on 09/06/2020: IMPRESSION: 1. Soft tissue thickening at the umbilicus with central low-density area measuring 12 x 7 x 7 mm containing a punctate calcification. This could reflect a small seroma or phlegmon. No surrounding inflammatory change. Correlate with physical exam. 2. No acute intra-abdominal process.  Assessment and Plan: [name] status post robotic assisted right femoral hernia repair and open umbilical hernia repair.  - The patient is now just over 7 weeks from her initial surgery.  She has had a persistent wound from the umbilicus which has not healed despite of antibiotic courses.  I think at this point this warrants further exploration in the operating room.  Discussed with her the planned surgery with exploration of umbilical wound, using the same prior incision to evaluate the deeper portions to see if there is a pocket of infection or other issues, clean the base of the umbilicus.  Reviewed the surgery at length including risks of bleeding, infection, and injury to surrounding structures, and she's willing to proceed. --Will schedule the patient for 09/13/20.   Melvyn Neth, [facility]

## 2020-09-08 NOTE — Patient Instructions (Addendum)
We have spoken with you today about having surgery.  Please see your Blue surgery sheet. Our surgery scheduler will call you to look at surgery dates and to go over information.  If you have any questions please call us.

## 2020-09-11 ENCOUNTER — Telehealth: Payer: Self-pay | Admitting: Surgery

## 2020-09-11 NOTE — Telephone Encounter (Signed)
Patient has been advised of Pre-Admission date/time, COVID Testing date and Surgery date.  Surgery Date: 09/13/20 Preadmission Testing Date: 09/12/20 (phone 8a-1p) Covid Testing Date: Not needed.    Patient has been made aware to call [phone], between 1-3:00pm the day before surgery, to find out what time to arrive for surgery.

## 2020-09-12 ENCOUNTER — Other Ambulatory Visit: Payer: Self-pay

## 2020-09-12 ENCOUNTER — Encounter
Admission: RE | Admit: 2020-09-12 | Discharge: 2020-09-12 | Disposition: A | Discharge status: Home / Self Care | Payer: Managed Care, Other (non HMO) | Source: Ambulatory Visit | Attending: Surgery | Admitting: Surgery

## 2020-09-12 NOTE — Patient Instructions (Signed)
Your procedure is scheduled on: tomorrow Report to .registration desk then 2nd floor surgery desk To find out your arrival time please call [phone] between 1PM - 3PM on today  Remember: Instructions that are not followed completely may result in serious medical risk,  up to and including death, or upon the discretion of your surgeon and anesthesiologist your  surgery may need to be rescheduled.     _X__ 1. Do not eat food after midnight the night before your procedure.                 No chewing gum or hard candies. You may drink clear liquids up to 2 hours                 before you are scheduled to arrive for your surgery- DO not drink clear                 liquids within 2 hours of the start of your surgery.                 Clear Liquids include:  water, apple juice without pulp, clear Gatorade, G2 or                  Gatorade Zero (avoid Red/Purple/Blue), Black Coffee or Tea (Do not add                 anything to coffee or tea). _____2.   Complete the "Ensure Clear Pre-surgery Clear Carbohydrate Drink"       provided to you, 2 hours before arrival. **If you are diabetic you will be       provided with an alternative drink, Gatorade Zero or G2.  __X__2.  On the morning of surgery brush your teeth with toothpaste and water, you                may rinse your mouth with mouthwash if you wish.  Do not swallow any         toothpaste of mouthwash.     _X__ 3.  No Alcohol for 24 hours before or after surgery.   __ 4.  Do Not Smoke or use e-cigarettes For 24 Hours Prior to Your Surgery.                 Do not use any chewable tobacco products for at least 6 hours prior to                 Surgery.  __  5.  Do not use any recreational drugs (marijuana, cocaine, heroin, ecstasy,       MDMA or other) For at least one week prior to your surgery.            Combination of these drugs with anesthesia may have life threatening       results.  ____  6.  Bring all  medications with you on the day of surgery if instructed.   ___x_  7.  Notify your doctor if there is any change in your medical condition      (cold, fever, infections).     Do not wear jewelry, make-up, hairpins, clips or nail polish. Do not wear lotions, powders, or perfumes. You may wear deodorant. Do not shave 48 hours prior to surgery.  Do not bring valuables to the hospital.    [facility] is not responsible for any belongings or valuables.  Contacts, dentures or bridgework may not be worn  into surgery. Leave your suitcase in the car. After surgery it may be brought to your room. For patients admitted to the hospital, discharge time is determined by your treatment team.   Patients discharged the day of surgery will not be allowed to drive home.   Make arrangements for someone to be with you for the first 24 hours of your Same Day Discharge.    Please read over the following fact sheets that you were given:       __x__ Take these medicines the morning of surgery with A SIP OF WATER:    1. Tylenol if needed  2. BCP  3.   4.  5.  6.  ____ Fleet Enema (as directed)   _x___ Shower tonight and in the morning.  ____ Use Benzoyl Peroxide Gel as instructed  ____ Use inhalers on the day of surgery  ____ Stop metformin 2 days prior to surgery    ____ Take 1/2 of usual insulin dose the night before surgery. No insulin the morning          of surgery.   ____ Stop Coumadin/Plavix/aspirin on   __x__ Stop Anti-inflammatories ibuprofen  today   ____ Stop supplements until after surgery.    ____ Bring C-Pap to the hospital.    If you have any questions regarding your pre-procedure instructions,  Please call Pre-admit Testing at [phone]

## 2020-09-13 ENCOUNTER — Encounter: Payer: Self-pay | Admitting: Surgery

## 2020-09-13 ENCOUNTER — Ambulatory Visit
Admission: RE | Admit: 2020-09-13 | Discharge: 2020-09-13 | Disposition: A | Discharge status: Home / Self Care | Payer: Managed Care, Other (non HMO) | Attending: Surgery | Admitting: Surgery

## 2020-09-13 ENCOUNTER — Ambulatory Visit: Payer: Managed Care, Other (non HMO) | Admitting: Certified Registered"

## 2020-09-13 ENCOUNTER — Encounter
Admission: RE | Disposition: A | Discharge status: Home / Self Care | Payer: Self-pay | Source: Home / Self Care | Attending: Surgery

## 2020-09-13 ENCOUNTER — Other Ambulatory Visit: Payer: Self-pay

## 2020-09-13 DIAGNOSIS — Z79899 Other long term (current) drug therapy: Secondary | ICD-10-CM | POA: Diagnosis not present

## 2020-09-13 DIAGNOSIS — B9689 Other specified bacterial agents as the cause of diseases classified elsewhere: Secondary | ICD-10-CM | POA: Insufficient documentation

## 2020-09-13 DIAGNOSIS — X58XXXA Exposure to other specified factors, initial encounter: Secondary | ICD-10-CM | POA: Diagnosis not present

## 2020-09-13 DIAGNOSIS — Z1619 Resistance to other specified beta lactam antibiotics: Secondary | ICD-10-CM | POA: Diagnosis not present

## 2020-09-13 DIAGNOSIS — Z791 Long term (current) use of non-steroidal anti-inflammatories (NSAID): Secondary | ICD-10-CM | POA: Insufficient documentation

## 2020-09-13 DIAGNOSIS — T8141XA Infection following a procedure, superficial incisional surgical site, initial encounter: Secondary | ICD-10-CM | POA: Insufficient documentation

## 2020-09-13 DIAGNOSIS — K9189 Other postprocedural complications and disorders of digestive system: Secondary | ICD-10-CM | POA: Diagnosis not present

## 2020-09-13 DIAGNOSIS — Z793 Long term (current) use of hormonal contraceptives: Secondary | ICD-10-CM | POA: Diagnosis not present

## 2020-09-13 DIAGNOSIS — T8149XA Infection following a procedure, other surgical site, initial encounter: Secondary | ICD-10-CM

## 2020-09-13 DIAGNOSIS — S31105A Unspecified open wound of abdominal wall, periumbilic region without penetration into peritoneal cavity, initial encounter: Secondary | ICD-10-CM | POA: Diagnosis not present

## 2020-09-13 HISTORY — PX: WOUND EXPLORATION: SHX6188

## 2020-09-13 LAB — POCT PREGNANCY, URINE: Preg Test, Ur: NEGATIVE

## 2020-09-13 SURGERY — WOUND EXPLORATION
Anesthesia: General

## 2020-09-13 MED ORDER — DEXMEDETOMIDINE (PRECEDEX) IN NS 20 MCG/5ML (4 MCG/ML) IV SYRINGE
PREFILLED_SYRINGE | INTRAVENOUS | Status: DC | PRN
  Administered 2020-09-13: 12 ug via INTRAVENOUS
  Administered 2020-09-13: 8 ug via INTRAVENOUS

## 2020-09-13 MED ORDER — BUPIVACAINE-EPINEPHRINE (PF) 0.5% -1:200000 IJ SOLN
INTRAMUSCULAR | Status: AC
  Filled 2020-09-13: qty 30

## 2020-09-13 MED ORDER — FAMOTIDINE 20 MG PO TABS: 20.0000 mg | ORAL_TABLET | Freq: Once | ORAL | Status: AC

## 2020-09-13 MED ORDER — FENTANYL CITRATE (PF) 100 MCG/2ML IJ SOLN
INTRAMUSCULAR | Status: AC
  Filled 2020-09-13: qty 2

## 2020-09-13 MED ORDER — OXYCODONE HCL 5 MG PO TABS: 5.0000 mg | ORAL_TABLET | ORAL | 0 refills | Status: DC | PRN

## 2020-09-13 MED ORDER — PROPOFOL 10 MG/ML IV BOLUS
INTRAVENOUS | Status: AC
  Filled 2020-09-13: qty 20

## 2020-09-13 MED ORDER — CHLORHEXIDINE GLUCONATE CLOTH 2 % EX PADS
6.0000 | MEDICATED_PAD | Freq: Once | CUTANEOUS | Status: AC
  Administered 2020-09-13: 6 via TOPICAL

## 2020-09-13 MED ORDER — BUPIVACAINE-EPINEPHRINE (PF) 0.5% -1:200000 IJ SOLN
INTRAMUSCULAR | Status: DC | PRN
  Administered 2020-09-13: 30 mL

## 2020-09-13 MED ORDER — OXYCODONE HCL 5 MG PO TABS
ORAL_TABLET | ORAL | Status: AC
  Filled 2020-09-13: qty 1

## 2020-09-13 MED ORDER — CHLORHEXIDINE GLUCONATE CLOTH 2 % EX PADS: 6.0000 | MEDICATED_PAD | Freq: Once | CUTANEOUS | Status: DC

## 2020-09-13 MED ORDER — CEFAZOLIN SODIUM-DEXTROSE 2-4 GM/100ML-% IV SOLN
2.0000 g | INTRAVENOUS | Status: AC
  Administered 2020-09-13: 2 g via INTRAVENOUS

## 2020-09-13 MED ORDER — LACTATED RINGERS IV SOLN: INTRAVENOUS | Status: DC

## 2020-09-13 MED ORDER — FAMOTIDINE 20 MG PO TABS
ORAL_TABLET | ORAL | Status: AC
  Administered 2020-09-13: 20 mg via ORAL
  Filled 2020-09-13: qty 1

## 2020-09-13 MED ORDER — ROCURONIUM BROMIDE 100 MG/10ML IV SOLN
INTRAVENOUS | Status: DC | PRN
  Administered 2020-09-13: 50 mg via INTRAVENOUS

## 2020-09-13 MED ORDER — CEFAZOLIN SODIUM-DEXTROSE 2-4 GM/100ML-% IV SOLN
INTRAVENOUS | Status: AC
  Filled 2020-09-13: qty 100

## 2020-09-13 MED ORDER — CHLORHEXIDINE GLUCONATE 0.12 % MT SOLN
OROMUCOSAL | Status: AC
  Administered 2020-09-13: 15 mL via OROMUCOSAL
  Filled 2020-09-13: qty 15

## 2020-09-13 MED ORDER — OXYCODONE HCL 5 MG PO TABS
5.0000 mg | ORAL_TABLET | Freq: Once | ORAL | Status: AC
  Administered 2020-09-13: 5 mg via ORAL

## 2020-09-13 MED ORDER — ACETAMINOPHEN 500 MG PO TABS: 1000.0000 mg | ORAL_TABLET | ORAL | Status: AC

## 2020-09-13 MED ORDER — DEXAMETHASONE SODIUM PHOSPHATE 10 MG/ML IJ SOLN
INTRAMUSCULAR | Status: DC | PRN
Start: 2020-09-13 — End: 2020-09-13
  Administered 2020-09-13: 10 mg via INTRAVENOUS

## 2020-09-13 MED ORDER — FENTANYL CITRATE (PF) 100 MCG/2ML IJ SOLN
25.0000 ug | INTRAMUSCULAR | Status: DC | PRN
  Administered 2020-09-13: 50 ug via INTRAVENOUS

## 2020-09-13 MED ORDER — MIDAZOLAM HCL 2 MG/2ML IJ SOLN
INTRAMUSCULAR | Status: AC
  Filled 2020-09-13: qty 2

## 2020-09-13 MED ORDER — ONDANSETRON HCL 4 MG/2ML IJ SOLN
INTRAMUSCULAR | Status: DC | PRN
  Administered 2020-09-13: 4 mg via INTRAVENOUS

## 2020-09-13 MED ORDER — KETOROLAC TROMETHAMINE 30 MG/ML IJ SOLN
INTRAMUSCULAR | Status: DC | PRN
  Administered 2020-09-13: 30 mg via INTRAVENOUS

## 2020-09-13 MED ORDER — FENTANYL CITRATE (PF) 100 MCG/2ML IJ SOLN
INTRAMUSCULAR | Status: DC | PRN
  Administered 2020-09-13: 100 ug via INTRAVENOUS

## 2020-09-13 MED ORDER — SUGAMMADEX SODIUM 200 MG/2ML IV SOLN
INTRAVENOUS | Status: DC | PRN
  Administered 2020-09-13: 200 mg via INTRAVENOUS

## 2020-09-13 MED ORDER — LIDOCAINE HCL (CARDIAC) PF 100 MG/5ML IV SOSY
PREFILLED_SYRINGE | INTRAVENOUS | Status: DC | PRN
  Administered 2020-09-13: 100 mg via INTRAVENOUS

## 2020-09-13 MED ORDER — GABAPENTIN 300 MG PO CAPS
ORAL_CAPSULE | ORAL | Status: AC
  Administered 2020-09-13: 300 mg via ORAL
  Filled 2020-09-13: qty 1

## 2020-09-13 MED ORDER — KETAMINE HCL 10 MG/ML IJ SOLN
INTRAMUSCULAR | Status: DC | PRN
  Administered 2020-09-13: 20 mg via INTRAVENOUS

## 2020-09-13 MED ORDER — BUPIVACAINE LIPOSOME 1.3 % IJ SUSP: 20.0000 mL | Freq: Once | INTRAMUSCULAR | Status: DC

## 2020-09-13 MED ORDER — MIDAZOLAM HCL 2 MG/2ML IJ SOLN
INTRAMUSCULAR | Status: DC | PRN
  Administered 2020-09-13: 2 mg via INTRAVENOUS

## 2020-09-13 MED ORDER — ONDANSETRON HCL 4 MG/2ML IJ SOLN: 4.0000 mg | Freq: Once | INTRAMUSCULAR | Status: DC | PRN

## 2020-09-13 MED ORDER — PROPOFOL 10 MG/ML IV BOLUS
INTRAVENOUS | Status: DC | PRN
  Administered 2020-09-13: 150 mg via INTRAVENOUS

## 2020-09-13 MED ORDER — GABAPENTIN 300 MG PO CAPS: 300.0000 mg | ORAL_CAPSULE | ORAL | Status: AC

## 2020-09-13 MED ORDER — ORAL CARE MOUTH RINSE: 15.0000 mL | Freq: Once | OROMUCOSAL | Status: AC

## 2020-09-13 MED ORDER — IBUPROFEN 800 MG PO TABS: 800.0000 mg | ORAL_TABLET | Freq: Three times a day (TID) | ORAL | 1 refills | Status: DC | PRN

## 2020-09-13 MED ORDER — ACETAMINOPHEN 500 MG PO TABS: 1000.0000 mg | ORAL_TABLET | Freq: Four times a day (QID) | ORAL | Status: DC | PRN

## 2020-09-13 MED ORDER — ACETAMINOPHEN 500 MG PO TABS
ORAL_TABLET | ORAL | Status: AC
  Administered 2020-09-13: 1000 mg via ORAL
  Filled 2020-09-13: qty 2

## 2020-09-13 MED ORDER — CHLORHEXIDINE GLUCONATE 0.12 % MT SOLN: 15.0000 mL | Freq: Once | OROMUCOSAL | Status: AC

## 2020-09-13 MED ORDER — MEPERIDINE HCL 25 MG/ML IJ SOLN: 6.2500 mg | INTRAMUSCULAR | Status: DC | PRN

## 2020-09-13 SURGICAL SUPPLY — 33 items
ADH SKN CLS APL DERMABOND .7 (GAUZE/BANDAGES/DRESSINGS)
APL PRP STRL LF DISP 70% ISPRP (MISCELLANEOUS) ×1
BLADE SURG 15 STRL LF DISP TIS (BLADE) ×1 IMPLANT
BLADE SURG 15 STRL SS (BLADE) ×2
CANISTER SUCT 1200ML W/VALVE (MISCELLANEOUS) ×2 IMPLANT
CHLORAPREP W/TINT 26 (MISCELLANEOUS) ×2 IMPLANT
DERMABOND ADVANCED (GAUZE/BANDAGES/DRESSINGS)
DERMABOND ADVANCED .7 DNX12 (GAUZE/BANDAGES/DRESSINGS) IMPLANT
DRAPE LAPAROTOMY 77X122 PED (DRAPES) ×2 IMPLANT
ELECT CAUTERY BLADE TIP 2.5 (TIP) ×2
ELECT REM PT RETURN 9FT ADLT (ELECTROSURGICAL) ×2
ELECTRODE CAUTERY BLDE TIP 2.5 (TIP) ×1 IMPLANT
ELECTRODE REM PT RTRN 9FT ADLT (ELECTROSURGICAL) ×1 IMPLANT
GAUZE 4X4 16PLY ~~LOC~~+RFID DBL (SPONGE) ×2 IMPLANT
GLOVE SURG SYN 7.0 (GLOVE) ×4 IMPLANT
GLOVE SURG SYN 7.5  E (GLOVE) ×2
GLOVE SURG SYN 7.5 E (GLOVE) ×2 IMPLANT
GOWN STRL REUS W/ TWL LRG LVL3 (GOWN DISPOSABLE) ×3 IMPLANT
GOWN STRL REUS W/TWL LRG LVL3 (GOWN DISPOSABLE) ×6
MANIFOLD NEPTUNE II (INSTRUMENTS) ×2 IMPLANT
NEEDLE HYPO 22GX1.5 SAFETY (NEEDLE) ×2 IMPLANT
NS IRRIG 500ML POUR BTL (IV SOLUTION) ×2 IMPLANT
PACK BASIN MINOR ARMC (MISCELLANEOUS) ×2 IMPLANT
SUT ETHIBOND 0 MO6 C/R (SUTURE) IMPLANT
SUT MNCRL AB 4-0 PS2 18 (SUTURE) ×2 IMPLANT
SUT PROLENE 2 0 SH DA (SUTURE) ×2 IMPLANT
SUT VIC AB 2-0 SH 27 (SUTURE) ×2
SUT VIC AB 2-0 SH 27XBRD (SUTURE) ×1 IMPLANT
SUT VIC AB 3-0 SH 27 (SUTURE) ×2
SUT VIC AB 3-0 SH 27X BRD (SUTURE) ×1 IMPLANT
SYR 20ML LL LF (SYRINGE) ×2 IMPLANT
SYR BULB IRRIG 60ML STRL (SYRINGE) ×2 IMPLANT
WATER STERILE IRR 500ML POUR (IV SOLUTION) IMPLANT

## 2020-09-13 NOTE — Op Note (Signed)
Procedure Date:  09/13/2020  Pre-operative Diagnosis:  Chronic umbilical open wound  Post-operative Diagnosis: Chronic umbilical open wound  Procedure:  Umbilical wound exploration; excision of umbilical skin and subcutaneous tissue < 20 cm2.  Surgeon:  Melvyn Neth, MD  Anesthesia:  General endotracheal  Estimated Blood Loss:  5 ml  Specimens:  Culture swab  Complications:  None  Indications for Procedure:  [name] who presents with a chronic umbilical open wound.  She had prior umbilical hernia repair, with an open wound at the base of the umbilicus.  This has not healed despite of wound care and antibiotics, and she presents today for wound exploration.  The risks of bleeding, abscess or infection, injury to surrounding structures, and need for further procedures were all discussed with the patient and was willing to proceed.  Description of Procedure: The patient was correctly identified in the preoperative area and brought into the operating room.  The patient was placed supine with VTE prophylaxis in place.  Appropriate time-outs were performed.  Anesthesia was induced and the patient was intubated.  Appropriate antibiotics were infused.  The abdomen was prepped and draped in a sterile fashion.  The patient's prior umbilical incision, which measured about 3 cm x 0.5 cm was excised as the skin was very thin.  Cautery was then used to dissect down the subcutaneous tissue along the umbilical stalk.  Kelly forceps were used to dissect the umbilical stalk and it was divided at the fascia using cautery.  This allowed visualization of the base of the umbilicus, which had an open wound.  There was no purulent material, but culture swab was obtained as a precaution.  The base of the umbilicus had denuded skin which was not healthy.  This is likely the reason that the wound never healed well.  The unhealthy skin edges were cut down to healthy umbilical skin.  After this was  done, the umbilical wound was then closed using 2-0 Prolene in two layers.  The umbilicus originally was not deep, and this excision and closure resulted in a very shallow umbilicus.  At the fascia, there was an area of scar tissue in the location of the prior umbilical hernia.  No hernia defect was noted, but as a precaution, this area was reinforced with 0 Vicryl suture.  Local anesthetic was infiltrated onto the fascia and subcutaneous tissue.  The new umbilical stalk was then reattached to the fascia using 2-0 Vicryl sutures, and the wound was then closed in multiple layers using 2-0 Vicryl, 3-0 Vicryl, and 4-0 Monocryl.  The incision was cleaned and sealed with DermaBond.  The patient was emerged from anesthesia and extubated and brought to the recovery room for further management.  The patient tolerated the procedure well and all counts were correct at the end of the case.   Melvyn Neth, MD

## 2020-09-13 NOTE — Interval H&P Note (Signed)
History and Physical Interval Note:  09/13/2020 12:58 PM  Diana Whitaker  has presented today for surgery, with the diagnosis of Non-healing umbilical wound.  The various methods of treatment have been discussed with the patient and family. After consideration of risks, benefits and other options for treatment, the patient has consented to  Procedure(s) with comments: WOUND EXPLORATION (N/A) - Umbilical wound exploration as a surgical intervention.  The patient's history has been reviewed, patient examined, no change in status, stable for surgery.  I have reviewed the patient's chart and labs.  Questions were answered to the patient's satisfaction.     [name]

## 2020-09-13 NOTE — Transfer of Care (Signed)
Immediate Anesthesia Transfer of Care Note  Patient: Diana Whitaker  Procedure(s) Performed: WOUND EXPLORATION  Patient Location: PACU  Anesthesia Type:General  Level of Consciousness: drowsy  Airway & Oxygen Therapy: Patient Spontanous Breathing and Patient connected to face mask oxygen  Post-op Assessment: Report given to RN and Post -op Vital signs reviewed and stable  Post vital signs: Reviewed and stable  Last Vitals:  Vitals Value Taken Time  BP 122/63 09/13/20 1500  Temp 36.6 C 09/13/20 1500  Pulse 94 09/13/20 1502  Resp 13 09/13/20 1502  SpO2 100 % 09/13/20 1502  Vitals shown include unvalidated device data.  Last Pain:  Vitals:   09/13/20 1500  TempSrc:   PainSc: Asleep      Patients Stated Pain Goal: 0 ([id number])  Complications: No notable events documented.

## 2020-09-13 NOTE — Discharge Instructions (Signed)
AMBULATORY SURGERY  ?DISCHARGE INSTRUCTIONS ? ? ?The drugs that you were given will stay in your system until tomorrow so for the next 24 hours you should not: ? ?Drive an automobile ?Make any legal decisions ?Drink any alcoholic beverage ? ? ?You may resume regular meals tomorrow.  Today it is better to start with liquids and gradually work up to solid foods. ? ?You may eat anything you prefer, but it is better to start with liquids, then soup and crackers, and gradually work up to solid foods. ? ? ?Please notify your doctor immediately if you have any unusual bleeding, trouble breathing, redness and pain at the surgery site, drainage, fever, or pain not relieved by medication. ? ? ? ?Additional Instructions: ? ? ? ?Please contact your physician with any problems or Same Day Surgery at [phone], Monday through Friday 6 am to 4 pm, or [place] [facility] Main number at [phone].  ?

## 2020-09-13 NOTE — Anesthesia Preprocedure Evaluation (Signed)
Anesthesia Evaluation  Patient identified by MRN, date of birth, ID band Patient awake    Reviewed: Allergy & Precautions, NPO status , Patient's Chart, lab work & pertinent test results  History of Anesthesia Complications Negative for: history of anesthetic complications  Airway Mallampati: II  TM Distance: >3 FB Neck ROM: full    Dental  (+) Chipped   Pulmonary neg pulmonary ROS, neg shortness of breath,    Pulmonary exam normal        Cardiovascular Exercise Tolerance: Good (-) angina(-) Past MI negative cardio ROS Normal cardiovascular exam     Neuro/Psych  Headaches, Anxiety  Neuromuscular disease negative psych ROS   GI/Hepatic negative GI ROS, Neg liver ROS, neg GERD  ,  Endo/Other  negative endocrine ROS  Renal/GU      Musculoskeletal  (+) Arthritis ,   Abdominal   Peds  Hematology negative hematology ROS (+)   Anesthesia Other Findings Past Medical History: No date: Arthritis No date: Migraines 1999: Migraines  History reviewed. No pertinent surgical history.     Reproductive/Obstetrics negative OB ROS                            Anesthesia Physical  Anesthesia Plan  ASA: 2  Anesthesia Plan: General ETT   Post-op Pain Management:    Induction: Intravenous  PONV Risk Score and Plan: 3 and Ondansetron, Dexamethasone, Midazolam and Treatment may vary due to age or medical condition  Airway Management Planned: Oral ETT  Additional Equipment:   Intra-op Plan:   Post-operative Plan: Extubation in OR  Informed Consent: I have reviewed the patients History and Physical, chart, labs and discussed the procedure including the risks, benefits and alternatives for the proposed anesthesia with the patient or authorized representative who has indicated his/her understanding and acceptance.     Dental Advisory Given  Plan Discussed with: Anesthesiologist, CRNA and  Surgeon  Anesthesia Plan Comments: (Patient consented for risks of anesthesia including but not limited to:  - adverse reactions to medications - damage to eyes, teeth, lips or other oral mucosa - nerve damage due to positioning  - sore throat or hoarseness - Damage to heart, brain, nerves, lungs, other parts of body or loss of life  Patient voiced understanding.)       Anesthesia Quick Evaluation

## 2020-09-13 NOTE — Anesthesia Procedure Notes (Signed)
Procedure Name: Intubation Date/Time: 09/13/2020 1:16 PM Performed by: Biagio Borg, CRNA Pre-anesthesia Checklist: Patient identified, Emergency Drugs available, Suction available and Patient being monitored Patient Re-evaluated:Patient Re-evaluated prior to induction Oxygen Delivery Method: Circle system utilized Preoxygenation: Pre-oxygenation with 100% oxygen Induction Type: IV induction Ventilation: Mask ventilation without difficulty Laryngoscope Size: McGraph and 3 Grade View: Grade I Tube type: Oral Tube size: 7.0 mm Number of attempts: 1 Airway Equipment and Method: Stylet Placement Confirmation: ETT inserted through vocal cords under direct vision, positive ETCO2 and breath sounds checked- equal and bilateral Tube secured with: Tape Dental Injury: Teeth and Oropharynx as per pre-operative assessment

## 2020-09-14 ENCOUNTER — Encounter: Payer: Self-pay | Admitting: Surgery

## 2020-09-14 NOTE — Anesthesia Postprocedure Evaluation (Signed)
Anesthesia Post Note  Patient: Diana Whitaker  Procedure(s) Performed: WOUND EXPLORATION  Patient location during evaluation: PACU Anesthesia Type: General Level of consciousness: awake and alert, awake and oriented Pain management: pain level controlled Vital Signs Assessment: post-procedure vital signs reviewed and stable Respiratory status: spontaneous breathing, nonlabored ventilation and respiratory function stable Cardiovascular status: blood pressure returned to baseline and stable Postop Assessment: no apparent nausea or vomiting Anesthetic complications: no   No notable events documented.   Last Vitals:  Vitals:   09/13/20 1600 09/13/20 1617  BP: 115/71 131/76  Pulse: 70 78  Resp: 19 15  Temp: 36.7 C 36.7 C  SpO2: 99% 100%    Last Pain:  Vitals:   09/13/20 1617  TempSrc: Temporal  PainSc: 8                  Phill Mutter

## 2020-09-15 ENCOUNTER — Other Ambulatory Visit: Payer: Self-pay | Admitting: Surgery

## 2020-09-15 MED ORDER — CIPROFLOXACIN HCL 500 MG PO TABS: 500.0000 mg | ORAL_TABLET | Freq: Two times a day (BID) | ORAL | 0 refills | Status: DC

## 2020-09-18 ENCOUNTER — Inpatient Hospital Stay: Admission: RE | Admit: 2020-09-18 | Payer: Managed Care, Other (non HMO) | Source: Ambulatory Visit | Admitting: Surgery

## 2020-09-18 ENCOUNTER — Encounter: Payer: Self-pay | Admitting: Surgery

## 2020-09-18 ENCOUNTER — Other Ambulatory Visit: Payer: Self-pay

## 2020-09-18 ENCOUNTER — Observation Stay: Payer: Managed Care, Other (non HMO) | Admitting: Anesthesiology

## 2020-09-18 ENCOUNTER — Ambulatory Visit: Admission: EM | Admit: 2020-09-18 | Payer: Managed Care, Other (non HMO) | Source: Home / Self Care | Admitting: Surgery

## 2020-09-18 ENCOUNTER — Inpatient Hospital Stay
Admission: AD | Admit: 2020-09-18 | Discharge: 2020-09-20 | DRG: 863 | Disposition: A | Discharge status: Home Health | Payer: Managed Care, Other (non HMO) | Source: Ambulatory Visit | Attending: Surgery | Admitting: Surgery

## 2020-09-18 ENCOUNTER — Encounter
Admission: AD | Disposition: A | Discharge status: Home Health | Payer: Self-pay | Source: Ambulatory Visit | Attending: Surgery

## 2020-09-18 ENCOUNTER — Ambulatory Visit (INDEPENDENT_AMBULATORY_CARE_PROVIDER_SITE_OTHER): Payer: Managed Care, Other (non HMO) | Admitting: Surgery

## 2020-09-18 ENCOUNTER — Ambulatory Visit: Admit: 2020-09-18 | Payer: Managed Care, Other (non HMO) | Admitting: General Surgery

## 2020-09-18 ENCOUNTER — Telehealth: Payer: Self-pay

## 2020-09-18 VITALS — BP 114/79 | HR 114 | Temp 98.4°F | Ht 64.0 in | Wt 166.0 lb

## 2020-09-18 DIAGNOSIS — B9689 Other specified bacterial agents as the cause of diseases classified elsewhere: Secondary | ICD-10-CM

## 2020-09-18 DIAGNOSIS — T8149XA Infection following a procedure, other surgical site, initial encounter: Secondary | ICD-10-CM

## 2020-09-18 DIAGNOSIS — Z20822 Contact with and (suspected) exposure to covid-19: Secondary | ICD-10-CM | POA: Diagnosis present

## 2020-09-18 DIAGNOSIS — Y838 Other surgical procedures as the cause of abnormal reaction of the patient, or of later complication, without mention of misadventure at the time of the procedure: Secondary | ICD-10-CM | POA: Diagnosis present

## 2020-09-18 DIAGNOSIS — Z79899 Other long term (current) drug therapy: Secondary | ICD-10-CM

## 2020-09-18 DIAGNOSIS — G43909 Migraine, unspecified, not intractable, without status migrainosus: Secondary | ICD-10-CM | POA: Diagnosis present

## 2020-09-18 DIAGNOSIS — Z79818 Long term (current) use of other agents affecting estrogen receptors and estrogen levels: Secondary | ICD-10-CM

## 2020-09-18 HISTORY — PX: IRRIGATION AND DEBRIDEMENT ABSCESS: SHX5252

## 2020-09-18 LAB — BASIC METABOLIC PANEL
Anion gap: 8 (ref 5–15)
BUN: 11 mg/dL (ref 6–20)
CO2: 25 mmol/L (ref 22–32)
Calcium: 9.1 mg/dL (ref 8.9–10.3)
Chloride: 105 mmol/L (ref 98–111)
Creatinine, Ser: 0.78 mg/dL (ref 0.44–1.00)
GFR, Estimated: >60 mL/min (ref >60)
Glucose, Bld: 97 mg/dL (ref 70–99)
Potassium: 3.7 mmol/L (ref 3.5–5.1)
Sodium: 138 mmol/L (ref 135–145)

## 2020-09-18 LAB — CBC
HCT: 42.9 % (ref 36.0–46.0)
Hemoglobin: 14.5 g/dL (ref 12.0–15.0)
MCH: 31.7 pg (ref 26.0–34.0)
MCHC: 33.8 g/dL (ref 30.0–36.0)
MCV: 93.9 fL (ref 80.0–100.0)
Platelets: 381 10*3/uL (ref 150–400)
RBC: 4.57 MIL/uL (ref 3.87–5.11)
RDW: 11.9 % (ref 11.5–15.5)
WBC: 12 10*3/uL — ABNORMAL HIGH (ref 4.0–10.5)
nRBC: 0 % (ref 0.0–0.2)

## 2020-09-18 LAB — RESP PANEL BY RT-PCR (FLU A&B, COVID) ARPGX2
Influenza A by PCR: NEGATIVE
Influenza B by PCR: NEGATIVE
SARS Coronavirus 2 by RT PCR: NEGATIVE

## 2020-09-18 LAB — AEROBIC/ANAEROBIC CULTURE W GRAM STAIN (SURGICAL/DEEP WOUND): Gram Stain: NONE SEEN

## 2020-09-18 LAB — HIV ANTIBODY (ROUTINE TESTING W REFLEX): HIV Screen 4th Generation wRfx: NONREACTIVE

## 2020-09-18 LAB — POCT PREGNANCY, URINE: Preg Test, Ur: NEGATIVE

## 2020-09-18 SURGERY — IRRIGATION AND DEBRIDEMENT ABSCESS
Anesthesia: Choice

## 2020-09-18 SURGERY — IRRIGATION AND DEBRIDEMENT ABSCESS
Anesthesia: General

## 2020-09-18 MED ORDER — ACETAMINOPHEN 500 MG PO TABS: 1000.0000 mg | ORAL_TABLET | Freq: Four times a day (QID) | ORAL | Status: DC | PRN

## 2020-09-18 MED ORDER — ENOXAPARIN SODIUM 40 MG/0.4ML IJ SOSY: 40.0000 mg | PREFILLED_SYRINGE | INTRAMUSCULAR | Status: DC

## 2020-09-18 MED ORDER — DOCUSATE SODIUM 100 MG PO CAPS: 100.0000 mg | ORAL_CAPSULE | Freq: Two times a day (BID) | ORAL | Status: DC | PRN

## 2020-09-18 MED ORDER — LIDOCAINE-EPINEPHRINE 1 %-1:100000 IJ SOLN
INTRAMUSCULAR | Status: DC | PRN
  Administered 2020-09-18: 15 mL

## 2020-09-18 MED ORDER — MIDAZOLAM HCL 2 MG/2ML IJ SOLN
INTRAMUSCULAR | Status: DC | PRN
  Administered 2020-09-18: 2 mg via INTRAVENOUS

## 2020-09-18 MED ORDER — PANTOPRAZOLE SODIUM 40 MG IV SOLR
40.0000 mg | Freq: Every day | INTRAVENOUS | Status: DC
  Administered 2020-09-18 – 2020-09-19 (×2): 40 mg via INTRAVENOUS
  Filled 2020-09-18 (×3): qty 40

## 2020-09-18 MED ORDER — BUPIVACAINE HCL (PF) 0.25 % IJ SOLN
INTRAMUSCULAR | Status: AC
  Filled 2020-09-18: qty 30

## 2020-09-18 MED ORDER — KETOROLAC TROMETHAMINE 30 MG/ML IJ SOLN
30.0000 mg | Freq: Four times a day (QID) | INTRAMUSCULAR | Status: DC | PRN
  Administered 2020-09-18 – 2020-09-19 (×2): 30 mg via INTRAVENOUS
  Filled 2020-09-18: qty 1

## 2020-09-18 MED ORDER — FENTANYL CITRATE (PF) 100 MCG/2ML IJ SOLN
INTRAMUSCULAR | Status: AC
  Administered 2020-09-18: 50 ug via INTRAVENOUS
  Filled 2020-09-18: qty 2

## 2020-09-18 MED ORDER — LACTATED RINGERS IV SOLN: INTRAVENOUS | Status: DC | PRN

## 2020-09-18 MED ORDER — KETOROLAC TROMETHAMINE 30 MG/ML IJ SOLN: 30.0000 mg | Freq: Four times a day (QID) | INTRAMUSCULAR | Status: DC | PRN

## 2020-09-18 MED ORDER — LEVONORGEST-ETH ESTRAD 91-DAY 0.15-0.03 &0.01 MG PO TABS: 1.0000 | ORAL_TABLET | Freq: Every day | ORAL | Status: DC

## 2020-09-18 MED ORDER — PROPOFOL 10 MG/ML IV BOLUS
INTRAVENOUS | Status: AC
  Filled 2020-09-18: qty 20

## 2020-09-18 MED ORDER — ENOXAPARIN SODIUM 40 MG/0.4ML IJ SOSY
40.0000 mg | PREFILLED_SYRINGE | INTRAMUSCULAR | Status: DC
  Administered 2020-09-19 – 2020-09-20 (×2): 40 mg via SUBCUTANEOUS
  Filled 2020-09-18 (×2): qty 0.4

## 2020-09-18 MED ORDER — LIDOCAINE-EPINEPHRINE 1 %-1:100000 IJ SOLN
INTRAMUSCULAR | Status: AC
  Filled 2020-09-18: qty 1

## 2020-09-18 MED ORDER — ONDANSETRON 4 MG PO TBDP: 4.0000 mg | ORAL_TABLET | Freq: Four times a day (QID) | ORAL | Status: DC | PRN

## 2020-09-18 MED ORDER — BUPIVACAINE HCL (PF) 0.25 % IJ SOLN
INTRAMUSCULAR | Status: DC | PRN
  Administered 2020-09-18: 15 mL

## 2020-09-18 MED ORDER — LACTATED RINGERS IV SOLN: INTRAVENOUS | Status: DC

## 2020-09-18 MED ORDER — ONDANSETRON HCL 4 MG/2ML IJ SOLN: 4.0000 mg | Freq: Four times a day (QID) | INTRAMUSCULAR | Status: DC | PRN

## 2020-09-18 MED ORDER — OXYCODONE HCL 5 MG PO TABS
5.0000 mg | ORAL_TABLET | ORAL | Status: DC | PRN
  Administered 2020-09-18 – 2020-09-19 (×2): 5 mg via ORAL
  Filled 2020-09-18: qty 1

## 2020-09-18 MED ORDER — OXYCODONE HCL 5 MG PO TABS
ORAL_TABLET | ORAL | Status: AC
  Filled 2020-09-18: qty 1

## 2020-09-18 MED ORDER — CIPROFLOXACIN IN D5W 400 MG/200ML IV SOLN: 400.0000 mg | Freq: Two times a day (BID) | INTRAVENOUS | Status: DC

## 2020-09-18 MED ORDER — FENTANYL CITRATE (PF) 100 MCG/2ML IJ SOLN
25.0000 ug | INTRAMUSCULAR | Status: DC | PRN
  Administered 2020-09-18 (×2): 25 ug via INTRAVENOUS

## 2020-09-18 MED ORDER — ONDANSETRON HCL 4 MG/2ML IJ SOLN
4.0000 mg | Freq: Four times a day (QID) | INTRAMUSCULAR | Status: DC | PRN
  Administered 2020-09-19: 4 mg via INTRAVENOUS
  Filled 2020-09-18: qty 2

## 2020-09-18 MED ORDER — PROPOFOL 500 MG/50ML IV EMUL
INTRAVENOUS | Status: DC | PRN
  Administered 2020-09-18: 150 ug/kg/min via INTRAVENOUS

## 2020-09-18 MED ORDER — FENTANYL CITRATE (PF) 100 MCG/2ML IJ SOLN
INTRAMUSCULAR | Status: AC
  Filled 2020-09-18: qty 2

## 2020-09-18 MED ORDER — KETOROLAC TROMETHAMINE 30 MG/ML IJ SOLN
INTRAMUSCULAR | Status: AC
  Filled 2020-09-18: qty 1

## 2020-09-18 MED ORDER — GABAPENTIN 300 MG PO CAPS
300.0000 mg | ORAL_CAPSULE | Freq: Every day | ORAL | Status: DC
  Administered 2020-09-18 – 2020-09-19 (×2): 300 mg via ORAL
  Filled 2020-09-18 (×2): qty 1

## 2020-09-18 MED ORDER — 0.9 % SODIUM CHLORIDE (POUR BTL) OPTIME
TOPICAL | Status: DC | PRN
  Administered 2020-09-18: 100 mL

## 2020-09-18 MED ORDER — SUMATRIPTAN SUCCINATE 50 MG PO TABS
50.0000 mg | ORAL_TABLET | ORAL | Status: DC | PRN
  Filled 2020-09-18: qty 1

## 2020-09-18 MED ORDER — PANTOPRAZOLE SODIUM 40 MG IV SOLR: 40.0000 mg | Freq: Every day | INTRAVENOUS | Status: DC

## 2020-09-18 MED ORDER — POLYETHYLENE GLYCOL 3350 17 G PO PACK: 17.0000 g | PACK | Freq: Every day | ORAL | Status: DC | PRN

## 2020-09-18 MED ORDER — MIDAZOLAM HCL 2 MG/2ML IJ SOLN
INTRAMUSCULAR | Status: AC
  Filled 2020-09-18: qty 2

## 2020-09-18 MED ORDER — POLYETHYLENE GLYCOL 3350 17 G PO PACK
17.0000 g | PACK | Freq: Every day | ORAL | Status: DC | PRN
  Filled 2020-09-18: qty 1

## 2020-09-18 MED ORDER — PROPOFOL 500 MG/50ML IV EMUL
INTRAVENOUS | Status: AC
  Filled 2020-09-18: qty 50

## 2020-09-18 MED ORDER — HYDROMORPHONE HCL 1 MG/ML IJ SOLN: 0.5000 mg | INTRAMUSCULAR | Status: DC | PRN

## 2020-09-18 MED ORDER — ONDANSETRON HCL 4 MG/2ML IJ SOLN
INTRAMUSCULAR | Status: DC | PRN
  Administered 2020-09-18: 4 mg via INTRAVENOUS

## 2020-09-18 MED ORDER — IBUPROFEN 400 MG PO TABS: 800.0000 mg | ORAL_TABLET | Freq: Three times a day (TID) | ORAL | Status: DC | PRN

## 2020-09-18 MED ORDER — FENTANYL CITRATE (PF) 100 MCG/2ML IJ SOLN
INTRAMUSCULAR | Status: DC | PRN
  Administered 2020-09-18 (×2): 50 ug via INTRAVENOUS

## 2020-09-18 MED ORDER — SODIUM CHLORIDE 0.9 % IV SOLN
1.0000 g | INTRAVENOUS | Status: DC
  Administered 2020-09-18 – 2020-09-19 (×2): 1 g via INTRAVENOUS
  Filled 2020-09-18: qty 10
  Filled 2020-09-18 (×2): qty 1

## 2020-09-18 MED ORDER — HYDROMORPHONE HCL 1 MG/ML IJ SOLN
0.5000 mg | INTRAMUSCULAR | Status: DC | PRN
Start: 2020-09-18 — End: 2020-09-20
  Administered 2020-09-19 – 2020-09-20 (×3): 0.5 mg via INTRAVENOUS
  Filled 2020-09-18 (×3): qty 0.5

## 2020-09-18 MED ORDER — PROPOFOL 10 MG/ML IV BOLUS
INTRAVENOUS | Status: DC | PRN
  Administered 2020-09-18: 100 mg via INTRAVENOUS

## 2020-09-18 SURGICAL SUPPLY — 26 items
BRUSH SCRUB EZ  4% CHG (MISCELLANEOUS)
BRUSH SCRUB EZ 4% CHG (MISCELLANEOUS) IMPLANT
CANISTER SUCT 1200ML W/VALVE (MISCELLANEOUS) ×2 IMPLANT
DRAPE LAPAROTOMY 77X122 PED (DRAPES) ×2 IMPLANT
DRSG TEGADERM 4X4.75 (GAUZE/BANDAGES/DRESSINGS) ×2 IMPLANT
ELECT CAUTERY BLADE TIP 2.5 (TIP) ×2
ELECT REM PT RETURN 9FT ADLT (ELECTROSURGICAL) ×2
ELECTRODE CAUTERY BLDE TIP 2.5 (TIP) ×1 IMPLANT
ELECTRODE REM PT RTRN 9FT ADLT (ELECTROSURGICAL) ×1 IMPLANT
GAUZE 4X4 16PLY ~~LOC~~+RFID DBL (SPONGE) ×2 IMPLANT
GAUZE PACKING IODOFORM 1/2 (PACKING) ×2 IMPLANT
GAUZE SPONGE 4X4 12PLY STRL (GAUZE/BANDAGES/DRESSINGS) ×2 IMPLANT
GLOVE SURG SYN 6.5 ES PF (GLOVE) ×2 IMPLANT
GLOVE SURG UNDER LTX SZ7 (GLOVE) ×4 IMPLANT
GOWN STRL REUS W/ TWL LRG LVL3 (GOWN DISPOSABLE) ×1 IMPLANT
GOWN STRL REUS W/TWL LRG LVL3 (GOWN DISPOSABLE) ×2
KIT TURNOVER KIT A (KITS) ×2 IMPLANT
MANIFOLD NEPTUNE II (INSTRUMENTS) ×2 IMPLANT
NS IRRIG 1000ML POUR BTL (IV SOLUTION) ×2 IMPLANT
NS IRRIG 500ML POUR BTL (IV SOLUTION) ×2 IMPLANT
PACK BASIN MINOR ARMC (MISCELLANEOUS) ×2 IMPLANT
PAD PREP 24X41 OB/GYN DISP (PERSONAL CARE ITEMS) IMPLANT
SOL PREP PVP 2OZ (MISCELLANEOUS) ×2
SOLUTION PREP PVP 2OZ (MISCELLANEOUS) ×1 IMPLANT
SYR BULB IRRIG 60ML STRL (SYRINGE) ×2 IMPLANT
WATER STERILE IRR 500ML POUR (IV SOLUTION) ×2 IMPLANT

## 2020-09-18 NOTE — Op Note (Signed)
Operative Note  Preoperative Diagnosis: Postoperative wound infection  Postoperative Diagnosis: Same, nonviable umbilical tissue  Operation: Incision and drainage of surgical wound with debridement of 4.5 cm of skin and subcutaneous tissue  Surgeon: Fredirick Maudlin, MD  Assistant: None  Anesthesia: Conscious sedation  Findings: Slightly murky serous fluid within a deep umbilical wound cavity, but no gross purulent material identified.  The area of granulation tissue and umbilical skin appeared nonviable and was resected.  Fascia intact.  Indications: This is a 40 year old woman who is a patient of Dr. Mont Dutton.  He performed a robot-assisted laparoscopic femoral hernia repair, as well as an open umbilical hernia repair at the end of June.  She had issues with wound healing and infection at the umbilical wound.  She was on antibiotics, but continued to have pain.  On August 24, Dr. Hampton Abbot performed an umbilical wound exploration and excised some umbilical skin and subcutaneous tissue.  He also obtained a wound culture that ultimately grew Serratia marcescens.  She was placed on antibiotics, but over the weekend had worsening erythema and pain.  She was seen in clinic today, but was unable to tolerate a bedside I&D.  As a result, her procedure needed to be done in the operating room tonight.  Procedure In Detail: The patient was identified in the preoperative holding area and brought to the operating room where she was placed supine on the OR table.  Bony prominences were padded and bilateral sequential compression devices were placed on the lower extremities.  She was administered anesthesia intravenously to the point of conscious sedation.  As she was already receiving oral antibiotics, no IV antibiotics were administered prophylactically.  She was sterilely prepped and draped in standard fashion.  A timeout was performed confirming her identity, the procedure being performed, her allergies, all  necessary equipment was available, and that her anesthesia was adequate.  A one-to-one mixture of 0.25% bupivacaine and 1% lidocaine with epinephrine was infiltrated into the skin and subcutaneous tissue surrounding her umbilical incision.  The existing wound was then reopened.  There was a small amount of slightly murky serous fluid, but no gross pus.  The underside of the residual umbilical skin did not appear viable, being dark gray and nonbleeding.  I elected to excise this.  The area of tissue removed measured 3 x 1.5 cm.  I then curetted some additional fibrinous exudate and nonviable fat from the wound.  The wound was then irrigated and confirmed to be hemostatic.  It was packed with half-inch iodoform gauze and covered with plain dry gauze and a Tegaderm.  The patient was aroused from her anesthetic and taken to the recovery area in good condition.  She will be admitted for additional wound care and IV antibiotics.  EBL: Less than 1 cc  IVF: See anesthesia record  Specimen(s): None  Complications: none immediately apparent.   Counts: all needles, instruments, and sponges were counted and reported to be correct in number at the end of the case.   I was present for and participated in the entire operation.  Fredirick Maudlin 8:23 PM

## 2020-09-18 NOTE — Anesthesia Postprocedure Evaluation (Signed)
Anesthesia Post Note  Patient: Diana Whitaker  Procedure(s) Performed: IRRIGATION AND [place] ABSCESS  Patient location during evaluation: PACU Anesthesia Type: General Level of consciousness: awake and alert Pain management: pain level controlled Vital Signs Assessment: post-procedure vital signs reviewed and stable Respiratory status: spontaneous breathing, nonlabored ventilation, respiratory function stable and patient connected to nasal cannula oxygen Cardiovascular status: blood pressure returned to baseline and stable Postop Assessment: no apparent nausea or vomiting Anesthetic complications: no   No notable events documented.   Last Vitals:  Vitals:   09/18/20 2206 09/18/20 2300  BP: 107/67 108/72  Pulse: 61 66  Resp:  16  Temp: 36.5 C 36.6 C  SpO2: 99% 98%    Last Pain:  Vitals:   09/18/20 2130  TempSrc:   PainSc: Asleep                 Martha Clan

## 2020-09-18 NOTE — Telephone Encounter (Signed)
Wound exploration-redness and looks like pus drainage under the glue- spoke with Dr.Piscoya and patient added to schedule today @ 1:45 pm.

## 2020-09-18 NOTE — Patient Instructions (Signed)

## 2020-09-18 NOTE — Anesthesia Preprocedure Evaluation (Signed)
Anesthesia Evaluation  Patient identified by MRN, date of birth, ID band Patient awake    Reviewed: Allergy & Precautions, NPO status , Patient's Chart, lab work & pertinent test results  History of Anesthesia Complications Negative for: history of anesthetic complications  Airway Mallampati: II  TM Distance: >3 FB Neck ROM: full    Dental  (+) Chipped, Dental Advidsory Given   Pulmonary neg pulmonary ROS, neg shortness of breath, neg recent URI,    Pulmonary exam normal        Cardiovascular Exercise Tolerance: Good (-) angina(-) Past MI negative cardio ROS Normal cardiovascular exam     Neuro/Psych  Headaches, neg Seizures Anxiety  Neuromuscular disease negative psych ROS   GI/Hepatic negative GI ROS, Neg liver ROS, neg GERD  ,  Endo/Other  negative endocrine ROS  Renal/GU      Musculoskeletal  (+) Arthritis ,   Abdominal   Peds  Hematology negative hematology ROS (+)   Anesthesia Other Findings Past Medical History: No date: Arthritis No date: Migraines 1999: Migraines  History reviewed. No pertinent surgical history.     Reproductive/Obstetrics negative OB ROS                             Anesthesia Physical  Anesthesia Plan  ASA: 2  Anesthesia Plan: General   Post-op Pain Management:    Induction: Intravenous  PONV Risk Score and Plan: 3 and Treatment may vary due to age or medical condition, TIVA and Propofol infusion  Airway Management Planned: Natural Airway and Simple Face Mask  Additional Equipment:   Intra-op Plan:   Post-operative Plan: Extubation in OR  Informed Consent: I have reviewed the patients History and Physical, chart, labs and discussed the procedure including the risks, benefits and alternatives for the proposed anesthesia with the patient or authorized representative who has indicated his/her understanding and acceptance.     Dental  Advisory Given  Plan Discussed with: Anesthesiologist, CRNA and Surgeon  Anesthesia Plan Comments: (Patient consented for risks of anesthesia including but not limited to:  - adverse reactions to medications - damage to eyes, teeth, lips or other oral mucosa - nerve damage due to positioning  - sore throat or hoarseness - Damage to heart, brain, nerves, lungs, other parts of body or loss of life  Patient voiced understanding.)        Anesthesia Quick Evaluation

## 2020-09-18 NOTE — Transfer of Care (Signed)
Immediate Anesthesia Transfer of Care Note  Patient: Diana Whitaker  Procedure(s) Performed: IRRIGATION AND DEBRIDEMENT ABSCESS  Patient Location: PACU  Anesthesia Type:General  Level of Consciousness: awake, alert , oriented and patient cooperative  Airway & Oxygen Therapy: Patient Spontanous Breathing  Post-op Assessment: Report given to RN and Post -op Vital signs reviewed and stable  Post vital signs: Reviewed and stable  Last Vitals:  Vitals Value Taken Time  BP    Temp    Pulse 88 09/18/20 2028  Resp    SpO2 96 % 09/18/20 2028  Vitals shown include unvalidated device data.  Last Pain:  Vitals:   09/18/20 2020  TempSrc:   PainSc: 10-Worst pain ever         Complications: No notable events documented.

## 2020-09-18 NOTE — H&P (Signed)
Date of Admission:  09/18/2020  Reason for Admission:  Umbilical wound infection  History of Present Illness: Diana Whitaker is a 40 y.o. female s/p Umbilical wound exploration for chronic non-healing wound on 09/13/20.  The wound did not appear to be infected at the time, but culture swab did show Serratia after 3 days of showing no organisms.  She was started on Cipro on 09/16/20, but the patient called the office today stating that she was in a lot of pain at the umbilical incision and there was purulent drainage that had started.  Denies any fevers, chills.  She's taking the Cipro as instructed.    Past Medical History: Past Medical History:  Diagnosis Date   Arthritis    Migraines    Migraines 1999     Past Surgical History: Past Surgical History:  Procedure Laterality Date   LAPAROSCOPIC INGUINAL HERNIA WITH UMBILICAL HERNIA N/A [id number]   with mesh   LAPAROSCOPY N/A 07/18/2020   Procedure: LAPAROSCOPY DIAGNOSTIC;  Surgeon: Olean Ree, MD;  Location: [facility];  Service: General;  Laterality: N/A;   WOUND EXPLORATION N/A 09/13/2020   Procedure: WOUND EXPLORATION;  Surgeon: Olean Ree, MD;  Location: [facility];  Service: General;  Laterality: N/A;  Umbilical wound exploration   XI ROBOTIC ASSISTED INGUINAL HERNIA REPAIR WITH MESH Right 07/18/2020   Procedure: XI ROBOTIC ASSISTED INGUINAL HERNIA REPAIR WITH MESH;  Surgeon: Olean Ree, MD;  Location: [facility];  Service: General;  Laterality: Right;    Home Medications: Prior to Admission medications   Medication Sig Start Date End Date Taking? Authorizing Provider  acetaminophen (TYLENOL) 500 MG tablet Take 2 tablets (1,000 mg total) by mouth every 6 (six) hours as needed for mild pain. 09/13/20  Yes Venice Marcucci, Jacqulyn Bath, MD  ciprofloxacin (CIPRO) 500 MG tablet Take 1 tablet (500 mg total) by mouth 2 (two) times daily for 7 days. 09/15/20 09/22/20 Yes Ettie Krontz, Jacqulyn Bath, MD  Docusate Sodium (COLACE PO) Take 1 capsule by mouth daily as  needed (constipation).   Yes [provider]  FIBER PO Take 1 capsule by mouth daily as needed (constipation).   Yes [provider]  gabapentin (NEURONTIN) 300 MG capsule Take 1 capsule (300 mg total) by mouth 3 (three) times daily. Patient taking differently: Take 300 mg by mouth at bedtime. 08/02/20  Yes Sherica Paternostro, Jacqulyn Bath, MD  ibuprofen (ADVIL) 800 MG tablet Take 1 tablet (800 mg total) by mouth every 8 (eight) hours as needed for moderate pain. 09/13/20  Yes Darroll Bredeson, Jacqulyn Bath, MD  Levonorgestrel-Ethinyl Estradiol (AMETHIA) 0.15-0.03 &0.01 MG tablet Take 1 tablet by mouth daily. 10/08/19  Yes Elby Beck, FNP  oxyCODONE (OXY IR/ROXICODONE) 5 MG immediate release tablet Take 1 tablet (5 mg total) by mouth every 4 (four) hours as needed for severe pain. 09/13/20  Yes Laelyn Blumenthal, Jacqulyn Bath, MD  rizatriptan (MAXALT) 10 MG tablet MAY REPEAT IN 2 HOURS IF NEEDED Patient taking differently: Take 10 mg by mouth as needed for migraine. 06/14/20  Yes Dutch Quint B, FNP    Allergies: No Known Allergies  Social History:  reports that she has never smoked. She has never used smokeless tobacco. She reports that she does not drink alcohol and does not use drugs.   Family History: Family History  Adopted: Yes    Review of Systems: Review of Systems  Constitutional:  Negative for chills and fever.  HENT:  Negative for hearing loss.   Respiratory:  Negative for shortness of breath.   Cardiovascular:  Negative  for chest pain.  Gastrointestinal:  Positive for abdominal pain. Negative for nausea and vomiting.  Genitourinary:  Negative for dysuria.  Musculoskeletal:  Negative for myalgias.  Skin:        Redness and pain at umbilical incision site  Neurological:  Negative for dizziness.  Psychiatric/Behavioral:  Negative for depression.    Physical Exam BP 124/78   Pulse 86   Temp 98.5 F (36.9 C) (Temporal)   Resp 16   LMP 08/14/2020 (Exact Date)   SpO2 99%  CONSTITUTIONAL: No acute  distress HEENT:  Normocephalic, atraumatic, extraocular motion intact. NECK: Trachea is midline, and there is no jugular venous distension.  RESPIRATORY:  Normal respiratory effort without pathologic use of accessory muscles. CARDIOVASCULAR: Regular rhythm and rate. GI: Soft, non-distended, very tender to palpation at the umbilicus.  There is clear erythema and induration surrounding the umbilical incision.  There is some mild ecchymosis as well.  The wound still had DermaBond in place, and peeling this off was very tender for the patient, to the point that I could only peel a third of the dermabond MUSCULOSKELETAL:  Normal muscle strength and tone in all four extremities.  No peripheral edema or cyanosis. SKIN: Skin turgor is normal. There are no pathologic skin lesions.  NEUROLOGIC:  Motor and sensation is grossly normal.  Cranial nerves are grossly intact. PSYCH:  Alert and oriented to person, place and time. Affect is normal.  Laboratory Analysis: Results for orders placed or performed during the hospital encounter of 09/18/20 (from the past 24 hour(s))  Pregnancy, urine POC     Status: None   Collection Time: 09/18/20  5:09 PM  Result Value Ref Range   Preg Test, Ur NEGATIVE NEGATIVE  Basic metabolic panel     Status: None   Collection Time: 09/18/20  5:13 PM  Result Value Ref Range   Sodium 138 135 - 145 mmol/L   Potassium 3.7 3.5 - 5.1 mmol/L   Chloride 105 98 - 111 mmol/L   CO2 25 22 - 32 mmol/L   Glucose, Bld 97 70 - 99 mg/dL   BUN 11 6 - 20 mg/dL   Creatinine, Ser 0.78 0.44 - 1.00 mg/dL   Calcium 9.1 8.9 - 10.3 mg/dL   GFR, Estimated >60 >60 mL/min   Anion gap 8 5 - 15  CBC     Status: Abnormal   Collection Time: 09/18/20  5:13 PM  Result Value Ref Range   WBC 12.0 (H) 4.0 - 10.5 K/uL   RBC 4.57 3.87 - 5.11 MIL/uL   Hemoglobin 14.5 12.0 - 15.0 g/dL   HCT 42.9 36.0 - 46.0 %   MCV 93.9 80.0 - 100.0 fL   MCH 31.7 26.0 - 34.0 pg   MCHC 33.8 30.0 - 36.0 g/dL   RDW 11.9  11.5 - 15.5 %   Platelets 381 150 - 400 K/uL   nRBC 0.0 0.0 - 0.2 %    Imaging: No results found.  Assessment and Plan: [name] s/p umbilical wound exploration, now with wound infection.   --Attempted to do I&D in the office, but the patient cannot tolerate due to pain.  Discussed with her that at the most here we can give is lidocaine with bicarb, but we cannot do any sedation or pain medications.  As such, she is requesting to do this under sedation, and in turn, in the OR.  She had lunch (a sandwich) at 11:30 AM today, and after discussing with  the Anesthesia team, she would need to be NPO for 8 hrs prior to any sedation.   --Due to timing issues, she would have to go to the ER to be checked in and can then be started on PIV, IV abx, pain medications, labs.  From there she would go to the OR for surgery in the evening.  Right now there are no beds available for direct admission. --Discussed with the patient all this and she understands and will head to the ER.  Plan would be for I&D of umbilical wound.  Discussed with her the plan for opening the incision, draining it, washing it out, and leave it open with packing dressing changes.  Would do an overnight admission for observation and to be able to give IV antibiotics, with likely d/c home tomorrow afternoon. --Patient understands this plan and all questions have been answered.  Dr. Celine Ahr is the surgeon on call tonight and she has agreed to take her to the [facility].   Melvyn Neth, MD [facility] Pg:  [phone]

## 2020-09-18 NOTE — Progress Notes (Signed)
09/18/2020  HPI: Diana Whitaker is a 40 y.o. female s/p Umbilical wound exploration for chronic non-healing wound on 09/13/20.  The wound did not appear to be infected at the time, but culture swab did show Serratia after 3 days of showing no organisms.  She was started on Cipro on 09/16/20, but the patient called the office today stating that she was in a lot of pain at the umbilical incision and there was purulent drainage that had started.  Denies any fevers, chills.  She's taking the Cipro as instructed.  Vital signs: BP 114/79   Pulse (!) 114   Temp 98.4 F (36.9 C)   Ht '5\' 4"'$  (1.626 m)   Wt 166 lb (75.3 kg)   SpO2 97%   BMI 28.49 kg/m    Physical Exam: Constitutional:  No acute distress Abdomen: Soft, non-distended, very tender to palpation at the umbilicus.  There is clear erythema and induration surrounding the umbilical incision.  There is some mild ecchymosis as well.  The wound still had DermaBond in place, and peeling this off was very tender for the patient, to the point that I could only peel a third of the dermabond.  Assessment/Plan: [name] s/p umbilical wound exploration, now with wound infection.  --Attempted to do I&D in the office, but the patient cannot tolerate due to pain.  Discussed with her that at the most here we can give is lidocaine with bicarb, but we cannot do any sedation or pain medications.  As such, she is requesting to do this under sedation, and in turn, in the OR.  She had lunch (a sandwich) at 11:30 AM today, and after discussing with the Anesthesia team, she would need to be NPO for 8 hrs prior to any sedation.   --Due to timing issues, she would have to go to the ER to be checked in and can then be started on PIV, IV abx, pain medications, labs.  From there she would go to the OR for surgery in the evening.  Right now there are no beds available for direct admission. --Discussed with the patient all this and she understands and will  head to the ER.  Plan would be for I&D of umbilical wound.  Discussed with her the plan for opening the incision, draining it, washing it out, and leave it open with packing dressing changes.  Would do an overnight admission for observation and to be able to give IV antibiotics, with likely d/c home tomorrow afternoon. --Patient understands this plan and all questions have been answered.   Melvyn Neth, [facility]

## 2020-09-19 ENCOUNTER — Encounter: Payer: Self-pay | Admitting: General Surgery

## 2020-09-19 DIAGNOSIS — G43909 Migraine, unspecified, not intractable, without status migrainosus: Secondary | ICD-10-CM | POA: Diagnosis present

## 2020-09-19 DIAGNOSIS — Z79899 Other long term (current) drug therapy: Secondary | ICD-10-CM | POA: Diagnosis not present

## 2020-09-19 DIAGNOSIS — T8149XA Infection following a procedure, other surgical site, initial encounter: Principal | ICD-10-CM

## 2020-09-19 DIAGNOSIS — Z20822 Contact with and (suspected) exposure to covid-19: Secondary | ICD-10-CM | POA: Diagnosis present

## 2020-09-19 DIAGNOSIS — Z79818 Long term (current) use of other agents affecting estrogen receptors and estrogen levels: Secondary | ICD-10-CM | POA: Diagnosis not present

## 2020-09-19 DIAGNOSIS — Y838 Other surgical procedures as the cause of abnormal reaction of the patient, or of later complication, without mention of misadventure at the time of the procedure: Secondary | ICD-10-CM | POA: Diagnosis present

## 2020-09-19 MED ORDER — HYDROMORPHONE HCL 1 MG/ML IJ SOLN
1.0000 mg | Freq: Once | INTRAMUSCULAR | Status: AC
  Administered 2020-09-19: 1 mg via INTRAVENOUS
  Filled 2020-09-19: qty 1

## 2020-09-19 MED ORDER — OXYCODONE HCL 5 MG PO TABS
5.0000 mg | ORAL_TABLET | ORAL | Status: DC | PRN
  Administered 2020-09-19 – 2020-09-20 (×2): 10 mg via ORAL
  Filled 2020-09-19 (×2): qty 2

## 2020-09-19 NOTE — Care Management (Signed)
Per Zach surgery PA.  Anticipated length of need for wound vac is 4 weeks  The NPI number of [name] is [id number]

## 2020-09-19 NOTE — TOC Initial Note (Signed)
Transition of Care [facility]) - Initial/Assessment Note    Patient Details  Name: Diana Whitaker MRN: [id number] Date of Birth: 05-06-1980  Transition of Care [facility]) CM/SW Contact:    Beverly Sessions, RN Phone Number: 09/19/2020, 1:43 PM  Clinical Narrative:                  Patient admitted from home with wound infection Patient lives at home with husband   PCP [place]- denies issues obtaining medications   Notified that patient will need wound vac at discharge Referral made to [facility] with [facility].  Vac has been approved and can be delivered tomorrow if management of the vac can be arranged  I have reached out to multiple home health agencies -  [facility] to [facility] - [facility] - [facility] - unable to accept  - [facility] - unable to accept - [facility] - unable to accept - [facility] - awaiting response [facility] - awaiting response  - [facility] - Per Ebony Hail they could potentially accept the case but would likely have high copays.  Discussed with patient and she would like a confirmed about that she would have to pay out of pocket.  VM left for Ebony Hail at [facility] he confirms that he would be able to change vac in office once a week,  however he is unavailable from [place] with Maudie Mercury [facility],  they do not have any availability until the week of Sept 12th for vac changes     TOC supervisor, MD, and PA notified      Expected Discharge Plan: [place] Barriers to Discharge: Continued Medical Work up, No [place] will accept this patient   Patient Goals and CMS Choice        Expected Discharge Plan and Services Expected Discharge Plan: [place]       Living arrangements for the past 2 months: Single Family Home                                      Prior Living Arrangements/Services Living arrangements for  the past 2 months: Single Family Home Lives with:: Spouse Patient language and need for interpreter reviewed:: Yes        Need for Family Participation in Patient Care: Yes (Comment) Care giver support system in place?: Yes (comment)   Criminal Activity/Legal Involvement Pertinent to Current Situation/Hospitalization: No - Comment as needed  Activities of Daily Living Home Assistive Devices/Equipment: None ADL Screening (condition at time of admission) Patient's cognitive ability adequate to safely complete daily activities?: Yes Is the patient deaf or have difficulty hearing?: No Does the patient have difficulty seeing, even when wearing glasses/contacts?: No Does the patient have difficulty concentrating, remembering, or making decisions?: No Patient able to express need for assistance with ADLs?: Yes Does the patient have difficulty dressing or bathing?: No Independently performs ADLs?: Yes (appropriate for developmental age) Does the patient have difficulty walking or climbing stairs?: No Weakness of Legs: None Weakness of Arms/Hands: None  Permission Sought/Granted                  Emotional Assessment       Orientation: : Oriented to Self, Oriented to Place, Oriented to  Time, Oriented to Situation Alcohol / Substance Use: Not Applicable Psych Involvement: No (comment)  Admission diagnosis:  Wound infection after surgery [T81.49XA] Patient Active Problem List   Diagnosis Date Noted   Wound infection after surgery    Right groin pain    Umbilical hernia without obstruction and without gangrene    Enlarged lymph nodes 04/10/2020   Painful lumpy right breast 04/10/2020   Anxiety 02/15/2020   Cold sore 02/15/2020   Intractable migraine with aura with status migrainosus 02/15/2020   Cervical spine pain 08/12/2019   Pain in joint of right shoulder 08/12/2019   Impingement syndrome of right shoulder region 08/12/2019   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

## 2020-09-19 NOTE — Discharge Instructions (Addendum)
In addition to included general post-operative instructions,  Diet: Resume home diet.   Wound care: Continue wound vac dressing change as scheduled in clinic. Please remember to bring supplies  Medications: Resume all home medications. For mild to moderate pain: acetaminophen (Tylenol) or ibuprofen/naproxen (if no kidney disease). Combining Tylenol with alcohol can substantially increase your risk of causing liver disease. Narcotic pain medications, if prescribed, can be used for severe pain, though may cause nausea, constipation, and drowsiness. Do not combine Tylenol and Percocet (or similar) within a 6 hour period as Percocet (and similar) contain(s) Tylenol. If you do not need the narcotic pain medication, you do not need to fill the prescription.  Call office [phone] / [phone]) at any time if any questions, worsening pain, fevers/chills, bleeding, drainage from incision site, or other concerns.

## 2020-09-19 NOTE — TOC Progression Note (Signed)
Transition of Care [facility]) - Progression Note    Patient Details  Name: Diana Whitaker MRN: [id number] Date of Birth: 1980/05/07  Transition of Care [facility]) CM/SW Contact  Beverly Sessions, RN Phone Number: 09/19/2020, 4:01 PM  Clinical Narrative:    Requested clinical faxed to [facility] [place].  She is to review clinical, determine if they can accept the case.  If so she will get the co pays for me to present to the patient    Expected Discharge Plan: [place] Barriers to Discharge: Continued Medical Work up, No [place] will accept this patient  Expected Discharge Plan and Services Expected Discharge Plan: [place] arrangements for the past 2 months: Single Family Home                                       Social Determinants of Health (SDOH) Interventions    Readmission Risk Interventions No flowsheet data found.

## 2020-09-19 NOTE — Progress Notes (Addendum)
[facility] Hospital Day(s): 0.   Post op day(s): 1 Day Post-Op.   Interval History:  Patient seen and examined No acute events or new complaints overnight.  Patient reports she continues to have significant umbilical abdominal soreness No fever, chills, nausea, emesis No new labs this morning She continues on Ceftriaxone; Cx from 08/24 with Serratia; sensitivities reviewed  Vital signs in last 24 hours: [min-max] current  Temp:  [97.6 F (36.4 C)-98.5 F (36.9 C)] 98.3 F (36.8 C) (08/30 0828) Pulse Rate:  [61-114] 76 (08/30 0828) Resp:  [15-17] 17 (08/30 0828) BP: (107-124)/(65-80) 113/73 (08/30 0828) SpO2:  [96 %-100 %] 99 % (08/30 0828) Weight:  [75.3 kg] 75.3 kg (08/29 1348)             Intake/Output last 2 shifts:  08/29 0701 - 08/30 0700 In: 824.2 [I.V.:700; IV Piggyback:124.2] Out: 500 [Urine:500]   Physical Exam:  Constitutional: alert, cooperative and no distress  Respiratory: breathing non-labored at rest  Cardiovascular: regular rate and sinus rhythm  Integumentary: 4 x 1 x 5 cm umbilical wound, tissue appears healthy, fascia is intact, no surrounding erythema  Labs:  CBC Latest Ref Rng & Units 09/18/2020 08/07/2020 05/25/2020  WBC 4.0 - 10.5 K/uL 12.0(H) 9.9 10.3  Hemoglobin 12.0 - 15.0 g/dL 14.5 12.4 13.4  Hematocrit 36.0 - 46.0 % 42.9 37.2 39.4  Platelets 150 - 400 K/uL 381 339 352   CMP Latest Ref Rng & Units 09/18/2020 08/07/2020 05/25/2020  Glucose 70 - 99 mg/dL 97 99 141(H)  BUN 6 - 20 mg/dL '11 16 11  '$ Creatinine 0.44 - 1.00 mg/dL 0.78 0.69 0.70  Sodium 135 - 145 mmol/L 138 138 138  Potassium 3.5 - 5.1 mmol/L 3.7 3.8 3.6  Chloride 98 - 111 mmol/L 105 107 107  CO2 22 - 32 mmol/L '25 27 22  '$ Calcium 8.9 - 10.3 mg/dL 9.1 8.7(L) 8.8(L)  Total Protein 6.5 - 8.1 g/dL - - 7.1  Total Bilirubin 0.3 - 1.2 mg/dL - - 0.5  Alkaline Phos 38 - 126 U/L - - 58  AST 15 - 41 U/L - - 20  ALT 0 - 44 U/L - - 15     Imaging  studies: No new pertinent imaging studies   Assessment/Plan:  40 y.o. female 1 Day Post-Op s/p incision and drainage of infected umbilical surgical wound with debridement of 4.5 cm of skin and subcutaneous tissue   - I reviewed potential wound care options with the patient and her husband including continued packing -vs- wound vac placement. She understands that wound vac placement may extend her hospital stay, but she is agreeable with this modality. I was able to place wound vac at bedside without issue and obtain seal to -125 mmHg. She tolerated this well.    - Okay to continue diet - Continue IV Abx (Ceftriaxone); Cx with Serratia    - Pain control prn; antiemetics prn   - Consult CSW to establish [facility]; which will be limiting factor for disposition   All of the above findings and recommendations were discussed with the patient, patient's family (husband at bedside), and the medical team, and all of patient's and family's questions were answered to their expressed satisfaction.  -- [name], PA-C [facility] 09/19/2020, 9:00 AM [phone] M-F: 7am - 4pm   I saw and evaluated the patient.  I agree with the above documentation, exam, and plan, which I have edited where appropriate. Fredirick Maudlin  1:28  PM

## 2020-09-20 ENCOUNTER — Encounter: Payer: Self-pay | Admitting: Surgery

## 2020-09-20 MED ORDER — GABAPENTIN 300 MG PO CAPS: 300.0000 mg | ORAL_CAPSULE | Freq: Every day | ORAL | 0 refills | Status: DC

## 2020-09-20 MED ORDER — CIPROFLOXACIN HCL 500 MG PO TABS: 500.0000 mg | ORAL_TABLET | Freq: Two times a day (BID) | ORAL | 0 refills | Status: AC

## 2020-09-20 MED ORDER — HYDROMORPHONE HCL 2 MG PO TABS: 2.0000 mg | ORAL_TABLET | Freq: Four times a day (QID) | ORAL | 0 refills | Status: DC | PRN

## 2020-09-20 NOTE — TOC Transition Note (Signed)
Transition of Care [facility]) - CM/SW Discharge Note   Patient Details  Name: Diana Whitaker MRN: [id number] Date of Birth: 30-Jun-1980  Transition of Care [facility]) CM/SW Contact:  Beverly Sessions, RN Phone Number: 09/20/2020, 2:04 PM   Clinical Narrative:     Patient discharged today [employer] vac was delivered to room and placed on patient prior to discharge  Still no confirmed home health agency  Patient scheduled with [facility] for vac change on Friday and next Tuesday.  If no health agency is found then [facility] will continue Vac management  Patient and surgery team in agreement.   I have reached out [facility] [place] to determine if they can accept.  If they are able to accept will contact patient and surgeon     Barriers to Discharge: Continued Medical Work up, No [place] will accept this patient   Patient Goals and CMS Choice        Discharge Placement                       Discharge Plan and Services                                     Social Determinants of Health (SDOH) Interventions     Readmission Risk Interventions No flowsheet data found.

## 2020-09-20 NOTE — Discharge Summary (Signed)
Patient ID: CACY PHIN MRN: [id number] DOB/AGE: May 17, 1980 40 y.o.  Admit date: 09/18/2020 Discharge date: 09/20/2020   Discharge Diagnoses:  Active Problems:   Wound infection after surgery   Procedures: Incision and drainage of surgical wound with debridement  Hospital Course: Patient was admitted from clinic through the ER on [id number] with umbilical wound infection.  She was found to also have some non-viable tissue at the umbilicus, which was debrided and excised.  Her wound was initially packed with 1/2 inch gauze, but transitioned to wound vac yesterday.  She was approved for wound vac at home, and currently is pending Home Health agency for dressing changes at home vs dressing changes in the office.  Her pain is controlled with medications, her wound today on dressing change appeared healthy, and she will be discharged in good condition.  Consults: Case Management / SW.  Disposition: Discharge disposition: 01-Home or Self Care       Discharge Instructions     Call MD for:  persistant nausea and vomiting   Complete by: As directed    Call MD for:  redness, tenderness, or signs of infection (pain, swelling, redness, odor or green/yellow discharge around incision site)   Complete by: As directed    Call MD for:  severe uncontrolled pain   Complete by: As directed    Call MD for:  temperature >100.4   Complete by: As directed    Discharge instructions   Complete by: As directed    Follow up as scheduled for wound vac changes   Increase activity slowly   Complete by: As directed       Allergies as of 09/20/2020   No Known Allergies      Medication List     TAKE these medications    acetaminophen 500 MG tablet Commonly known as: TYLENOL Take 2 tablets (1,000 mg total) by mouth every 6 (six) hours as needed for mild pain.   ciprofloxacin 500 MG tablet Commonly known as: Cipro Take 1 tablet (500 mg total) by mouth 2 (two) times daily for 7 days.   COLACE  PO Take 1 capsule by mouth daily as needed (constipation).   FIBER PO Take 1 capsule by mouth daily as needed (constipation).   gabapentin 300 MG capsule Commonly known as: Neurontin Take 1 capsule (300 mg total) by mouth at bedtime.   HYDROmorphone 2 MG tablet Commonly known as: Dilaudid Take 1 tablet (2 mg total) by mouth every 6 (six) hours as needed for severe pain (take 30 minutes prior to dressing changes).   ibuprofen 800 MG tablet Commonly known as: ADVIL Take 1 tablet (800 mg total) by mouth every 8 (eight) hours as needed for moderate pain.   Levonorgestrel-Ethinyl Estradiol 0.15-0.03 &0.01 MG tablet Commonly known as: AMETHIA Take 1 tablet by mouth daily.   oxyCODONE 5 MG immediate release tablet Commonly known as: Oxy IR/ROXICODONE Take 1 tablet (5 mg total) by mouth every 4 (four) hours as needed for severe pain.   rizatriptan 10 MG tablet Commonly known as: MAXALT MAY REPEAT IN 2 HOURS IF NEEDED What changed: See the new instructions.        Follow-up Information     Tylene Fantasia, PA-C Follow up on 09/26/2020.   Specialty: Physician Assistant Why: Wound vac change, 11AM on 09/06.Marland KitchenMarland Kitchenapproved by Johnna Acosta information: [address] [place] [address] [phone]         Olean Ree, MD. Go on 09/22/2020.   Specialty: General  Surgery Why: Wound vac change, bring supplies Contact information: [address] [place] [address] [phone]

## 2020-09-20 NOTE — Progress Notes (Signed)
Patient discharged to home via wheelchair accompanied by husband.  Patient discharged with instructions, pertinent information, prescriptions, and personal belongings. Patient able to teach back instructions.  Patient sent home with medical supplies for wound vac. IV d/ced. No acute distress noted. Care relinquished.

## 2020-09-20 NOTE — Progress Notes (Signed)
[facility] Hospital Day(s): 1.   Post op day(s): 2 Days Post-Op.   Interval History:  Patient seen and examined No acute events or new complaints overnight.  Patient reports she still has some umbilical soreness, but improved some Some nausea secondary to the pain and narcotic pain medications yesterday during vac change No fever, chills, emesis She continues on Ceftriaxone; Cx from 08/24 with Serratia; sensitivities reviewed  Right now, biggest barrier to discharge is establishing home health agency for wound vac care at home.   Vital signs in last 24 hours: [min-max] current  Temp:  [98.2 F (36.8 C)-98.6 F (37 C)] 98.2 F (36.8 C) (08/31 0845) Pulse Rate:  [63-74] 69 (08/31 0845) Resp:  [15-18] 16 (08/31 0845) BP: (107-127)/(59-80) 127/80 (08/31 0845) SpO2:  [97 %-100 %] 97 % (08/31 0845)             Intake/Output last 2 shifts:  08/30 0701 - 08/31 0700 In: 300 [P.O.:300] Out: 950 [Urine:950]   Physical Exam:  Constitutional: alert, cooperative and no distress  Respiratory: breathing non-labored at rest  Cardiovascular: regular rate and sinus rhythm  Gastrointestinal: soft, umbilical soreness, non-distended, no rebound/guarding Integumentary: Wound vac to umbilical wound, good seal, minimal serosanguinous output  Labs:  CBC Latest Ref Rng & Units 09/18/2020 08/07/2020 05/25/2020  WBC 4.0 - 10.5 K/uL 12.0(H) 9.9 10.3  Hemoglobin 12.0 - 15.0 g/dL 14.5 12.4 13.4  Hematocrit 36.0 - 46.0 % 42.9 37.2 39.4  Platelets 150 - 400 K/uL 381 339 352   CMP Latest Ref Rng & Units 09/18/2020 08/07/2020 05/25/2020  Glucose 70 - 99 mg/dL 97 99 141(H)  BUN 6 - 20 mg/dL '11 16 11  '$ Creatinine 0.44 - 1.00 mg/dL 0.78 0.69 0.70  Sodium 135 - 145 mmol/L 138 138 138  Potassium 3.5 - 5.1 mmol/L 3.7 3.8 3.6  Chloride 98 - 111 mmol/L 105 107 107  CO2 22 - 32 mmol/L '25 27 22  '$ Calcium 8.9 - 10.3 mg/dL 9.1 8.7(L) 8.8(L)  Total Protein 6.5 - 8.1 g/dL - - 7.1   Total Bilirubin 0.3 - 1.2 mg/dL - - 0.5  Alkaline Phos 38 - 126 U/L - - 58  AST 15 - 41 U/L - - 20  ALT 0 - 44 U/L - - 15     Imaging studies: No new pertinent imaging studies   Assessment/Plan:  40 y.o. female 2 Days Post-Op s/p incision and drainage of infected umbilical surgical wound with debridement of 4.5 cm of skin and subcutaneous tissue   - Right now disposition is pending establishment of home health agency for wound vac changes. CSW is aware and following. I will be happy to assist as needed in clinic for these. Patient understands this is keeping her in the hospital at the moment. I did review with her, and her husband, role for wet to dry dressings in this case as a bridge to wound vac if we can not establish HH; however, they both feel they will not be able to do these secondary to patient tolerance.     - Okay to continue diet - Continue IV Abx (Ceftriaxone); Cx with Serratia               - Pain control prn; antiemetics prn     - Discharge Planning: Pending the above   All of the above findings and recommendations were discussed with the patient, patient's family (husband at bedside), and the medical team, and all  of their questions were answered to their expressed satisfaction.  -- [name], PA-C [facility] 09/20/2020, 9:11 AM [phone] M-F: 7am - 4pm

## 2020-09-22 ENCOUNTER — Ambulatory Visit (INDEPENDENT_AMBULATORY_CARE_PROVIDER_SITE_OTHER): Payer: Managed Care, Other (non HMO) | Admitting: Surgery

## 2020-09-22 ENCOUNTER — Other Ambulatory Visit: Payer: Self-pay

## 2020-09-22 ENCOUNTER — Encounter: Payer: Self-pay | Admitting: Surgery

## 2020-09-22 VITALS — BP 120/79 | HR 88 | Temp 98.3°F | Ht 64.0 in | Wt 165.0 lb

## 2020-09-22 DIAGNOSIS — T8149XA Infection following a procedure, other surgical site, initial encounter: Secondary | ICD-10-CM

## 2020-09-22 DIAGNOSIS — Z09 Encounter for follow-up examination after completed treatment for conditions other than malignant neoplasm: Secondary | ICD-10-CM

## 2020-09-22 NOTE — Progress Notes (Signed)
09/22/2020  HPI: Diana Whitaker is a 40 y.o. female s/p incision and drainage of surgical wound with debridement of 4.5 cm2 of skin and subcutaneous tissue on 09/18/2020 with Dr. Celine Ahr.  Patient was discharged on 09/20/2020 with a wound VAC in place and presents today for a wound VAC change.  Patient presents that she is having pain and discomfort at the umbilical area from the wound VAC but otherwise doing well with no nausea or vomiting.  Vital signs: BP 120/79   Pulse 88   Temp 98.3 F (36.8 C) (Oral)   Ht '5\' 4"'$  (1.626 m)   Wt 165 lb (74.8 kg)   SpO2 98%   BMI 28.32 kg/m    Physical Exam: Constitutional: No acute distress Abdomen: Soft, nondistended, tender to palpation at the umbilical area.  She is very sensitive while removing the tape in the black sponge dressing.  The wound itself though appears very healthy with no erythema, induration, or evidence of infection.  The wound bed itself is healthy with very good granulation tissue and no purulence or necrosis.  New black sponge dressing was applied with good suction and no leak on the wound VAC set up.  Assessment/Plan: [name] s/p I&D of surgical wound and debridement.  - Discussed with the patient that although she is still having pain, the wound is healing very well with very healthy tissue at the wound bed.  New dressing done today.  She will come back next Tuesday 9/6 for wound VAC change with Mr. Olean Ree in the office. - She may take ibuprofen, gabapentin, and either oxycodone or Dilaudid depending on the severity of her pain.  Discussed with her that the pain will start easing up as the wound bed becomes less inflamed and more granulation tissue deposits into the wound edges. - Continue antibiotics until course completed   Melvyn Neth, MD [facility]

## 2020-09-22 NOTE — Patient Instructions (Signed)
If you have any concerns or questions, please feel free to call our office. See follow up appointment below. 

## 2020-09-26 ENCOUNTER — Other Ambulatory Visit: Payer: Self-pay

## 2020-09-26 ENCOUNTER — Encounter: Payer: Self-pay | Admitting: Physician Assistant

## 2020-09-26 ENCOUNTER — Ambulatory Visit (INDEPENDENT_AMBULATORY_CARE_PROVIDER_SITE_OTHER): Payer: Managed Care, Other (non HMO) | Admitting: Physician Assistant

## 2020-09-26 VITALS — BP 132/78 | HR 80 | Temp 98.1°F | Wt 165.0 lb

## 2020-09-26 DIAGNOSIS — T8149XA Infection following a procedure, other surgical site, initial encounter: Secondary | ICD-10-CM

## 2020-09-26 DIAGNOSIS — Z09 Encounter for follow-up examination after completed treatment for conditions other than malignant neoplasm: Secondary | ICD-10-CM

## 2020-09-26 NOTE — Progress Notes (Signed)
Patient had vaginal discharge and lower abdominal pain.

## 2020-09-26 NOTE — Progress Notes (Signed)
[facility] POST-OP OFFICE VISIT  09/26/2020  HPI: Diana Whitaker is a 40 y.o. female 8 days s/p incision and drainage of infected umbilical surgical wound with debridement of 4.5 cm of skin and subcutaneous tissue  She has done well at home since last wound vac change on 09/02 with Dr Hampton Abbot. Most of her pain is centered around the vac changes. She is able to tolerate the vac at home with only intermittent pain. No fever, chills. No other complaints today.   Vital signs: BP 132/78   Pulse 80   Temp 98.1 F (36.7 C) (Oral)   Wt 165 lb (74.8 kg)   SpO2 98%   BMI 28.32 kg/m    Physical Exam: Constitutional: Well appearing female, NAD Skin: There is now a 4 x 1 x 3.5 cm wound at her umbilicus, the entire wound bed is healthy granulation tissue, no surrounding erythema.   New wound vac was placed and seal obtained to -125 mmhg. I was able to use about 2-3 ccs of lidocaine sprayed into the wound bed and allowed to sit to help with patient tolerance of this procedure.   Assessment/Plan: [name] 8 days s/p incision and drainage of infected umbilical surgical wound with debridement of 4.5 cm of skin and subcutaneous tissue   - Wound vac changed as mentioned above; I will see her on Friday to get her on MWF scheduled  - Pain control prn  -- [name], PA-C [facility] 09/26/2020, 11:43 AM [phone] M-F: 7am - 4pm

## 2020-09-26 NOTE — Patient Instructions (Addendum)
If you have any concerns or questions, please feel free to call our office. See follow up appointment.  You can take Oxycodone at 9:30 am and Dilaudid 30 minutes later before your appointment.

## 2020-09-27 ENCOUNTER — Encounter: Payer: Managed Care, Other (non HMO) | Admitting: Surgery

## 2020-09-27 ENCOUNTER — Telehealth: Payer: Self-pay | Admitting: *Deleted

## 2020-09-27 NOTE — Telephone Encounter (Signed)
Completed patients spouse Kassey Khaled [facility] and he will pick up at the patients appointment on 09/29/20

## 2020-09-29 ENCOUNTER — Other Ambulatory Visit: Payer: Self-pay

## 2020-09-29 ENCOUNTER — Encounter: Payer: Self-pay | Admitting: Physician Assistant

## 2020-09-29 ENCOUNTER — Ambulatory Visit (INDEPENDENT_AMBULATORY_CARE_PROVIDER_SITE_OTHER): Payer: Managed Care, Other (non HMO) | Admitting: Physician Assistant

## 2020-09-29 ENCOUNTER — Encounter: Payer: Managed Care, Other (non HMO) | Admitting: Physician Assistant

## 2020-09-29 VITALS — BP 115/74 | HR 70 | Temp 98.8°F | Ht 64.0 in | Wt 155.0 lb

## 2020-09-29 DIAGNOSIS — Z09 Encounter for follow-up examination after completed treatment for conditions other than malignant neoplasm: Secondary | ICD-10-CM

## 2020-09-29 DIAGNOSIS — T8149XA Infection following a procedure, other surgical site, initial encounter: Secondary | ICD-10-CM

## 2020-09-29 NOTE — Progress Notes (Signed)
[facility] POST-OP OFFICE VISIT  09/29/2020  HPI: Diana Whitaker is a 40 y.o. female 11 days s/p incision and drainage of infected umbilical surgical wound with debridement of 4.5 cm of skin and subcutaneous tissue  Doing reasonably well at home with wound vac Still significantly sore with changes No fever, chills  Vital signs: BP 115/74   Pulse 70   Temp 98.8 F (37.1 C) (Oral)   Ht '5\' 4"'$  (1.626 m)   Wt 155 lb (70.3 kg)   SpO2 98%   BMI 26.61 kg/m    Physical Exam: Constitutional: Well appearing female, NAD Skin: There is now a 3.5 x 1 x 3 cm wound at her umbilicus, the entire wound bed is healthy granulation tissue, no surrounding erythema.   New wound vac was placed and seal obtained to -125 mmhg. I was able to use about 2-3 ccs of lidocaine sprayed into the wound bed and allowed to sit to help with patient tolerance of this procedure.   Assessment/Plan: [name] 11 days s/p incision and drainage of infected umbilical surgical wound with debridement of 4.5 cm of skin and subcutaneous tissue   - She will follow up with Dr Hampton Abbot on Monday for vac change. I will be happy to continue to follow her MWF as needed for these as well  -- [name], PA-C [facility] 09/29/2020, 12:58 PM [phone] M-F: 7am - 4pm

## 2020-09-30 ENCOUNTER — Encounter: Payer: Self-pay | Admitting: Surgery

## 2020-09-30 NOTE — Progress Notes (Unsigned)
Patient called answering service stating she is having vac issues. She states the machine keeps increasing the negative pressure measurement without any input. She called the company and they try to troubleshoot but was unable to resolve it so they recommended sending a new machine. However, this new machine has not arrived in the planned timeframe. I stated that the increasing pressure is not typically a dressing related problem and a problem of the machine itself, and I agree with the company recommendations.   If the machine was not delivered tonight as promised, the only other alternative is to take down the dressing and switch to wet to dry changes until a new machine is available. I asked the patient if they will be able to do wet to dry at home, and the patient vehemently denied they will be able to accomplish this. Therefore, I recommended pt visit the ED to have this done. I explain to her that wet to dry dressings are usually done on a daily basis, but due to the weekend, once the initial change is completed, the dressing can stay in place until follow up in the office when it opens in a couple days. She verbalized understanding. She stated she will try to wait for the back for another hour or so prior to heading over to the ED.

## 2020-10-02 ENCOUNTER — Ambulatory Visit (INDEPENDENT_AMBULATORY_CARE_PROVIDER_SITE_OTHER): Payer: Managed Care, Other (non HMO) | Admitting: Physician Assistant

## 2020-10-02 ENCOUNTER — Other Ambulatory Visit: Payer: Self-pay

## 2020-10-02 ENCOUNTER — Encounter: Payer: Managed Care, Other (non HMO) | Admitting: Surgery

## 2020-10-02 ENCOUNTER — Encounter: Payer: Self-pay | Admitting: Physician Assistant

## 2020-10-02 VITALS — BP 148/83 | HR 86 | Temp 98.0°F | Ht 64.0 in | Wt 155.0 lb

## 2020-10-02 DIAGNOSIS — T8149XA Infection following a procedure, other surgical site, initial encounter: Secondary | ICD-10-CM

## 2020-10-02 DIAGNOSIS — Z09 Encounter for follow-up examination after completed treatment for conditions other than malignant neoplasm: Secondary | ICD-10-CM

## 2020-10-02 NOTE — Patient Instructions (Addendum)
We removed the wound vac today. You will begin doing wet to dry dressing changes daily.  Please call the office if you have any questions or concerns. We will see you back on Wednesday 10/04/20.

## 2020-10-02 NOTE — Progress Notes (Signed)
[facility] POST-OP OFFICE VISIT  10/02/2020  HPI: Diana Whitaker is a 40 y.o. female 14 days s/p incision and drainage of infected umbilical surgical wound with debridement of 4.5 cm of skin and subcutaneous tissue  She had lots of issues over the weekend with her wound vac, and she is interested in stopping this modality No fever, chills Still having significant soreness and burning at the wound No new issues  Vital signs: BP (!) 148/83   Pulse 86   Temp 98 F (36.7 C)   Ht '5\' 4"'$  (1.626 m)   Wt 155 lb (70.3 kg)   LMP 07/19/2020 (Approximate)   SpO2 98%   BMI 26.61 kg/m    Physical Exam: Constitutional: Well appearing female, NAD Skin: There is now a 3 x 1 x 3 cm wound at her umbilicus, the entire wound bed is healthy granulation tissue, no surrounding erythema.   Assessment/Plan: [name] 14 days s/p incision and drainage of infected umbilical surgical wound with debridement of 4.5 cm of skin and subcutaneous tissue   - We will stop her wound vac today; I suspect the small size of the wound is contributing to her issues with wound vac at home.   - We will transition to wet to dry dressing changes daily at home; I preformed this today and reviewed steps with her. She was given wound care supplies.   - Continue pain control prn  - I will see her on Wednesday for re-check and to ensure this change in wound care has gone well  -- [name], PA-C [facility] 10/02/2020, 11:12 AM [phone] M-F: 7am - 4pm

## 2020-10-04 ENCOUNTER — Ambulatory Visit (INDEPENDENT_AMBULATORY_CARE_PROVIDER_SITE_OTHER): Payer: Managed Care, Other (non HMO) | Admitting: Physician Assistant

## 2020-10-04 ENCOUNTER — Other Ambulatory Visit: Payer: Self-pay

## 2020-10-04 ENCOUNTER — Encounter: Payer: Self-pay | Admitting: Physician Assistant

## 2020-10-04 VITALS — BP 117/70 | HR 80 | Temp 98.2°F | Ht 64.0 in | Wt 166.6 lb

## 2020-10-04 DIAGNOSIS — Z09 Encounter for follow-up examination after completed treatment for conditions other than malignant neoplasm: Secondary | ICD-10-CM

## 2020-10-04 DIAGNOSIS — T8149XA Infection following a procedure, other surgical site, initial encounter: Secondary | ICD-10-CM

## 2020-10-04 NOTE — Progress Notes (Signed)
[facility] POST-OP OFFICE VISIT  10/04/2020  HPI: Diana Whitaker is a 41 y.o. female 16 days s/p incision and drainage of infected umbilical surgical wound with debridement of 4.5 cm of skin and subcutaneous tissue  She has done well at home since transitioning to wet-to-dry dressings daily. She has been able to manage these herself. No fever, chills. No other complaints.   Vital signs: BP 117/70   Pulse 80   Temp 98.2 F (36.8 C) (Oral)   Ht '5\' 4"'$  (1.626 m)   Wt 166 lb 9.6 oz (75.6 kg)   SpO2 97%   BMI 28.60 kg/m    Physical Exam: Constitutional: Well appearing female, NAD Skin: Wound remains approximately 3 x 1 x 3 cm at her umbilicus, the entire wound bed is healthy granulation tissue, no surrounding erythema.   Assessment/Plan: [name] 16 days s/p incision and drainage of infected umbilical surgical wound with debridement of 4.5 cm of skin and subcutaneous tissue   - Continue wet to dry dressings daily + as needed at home; Reviewed this with the patient and her husband. They have done a good job at home to this point.   - Continue pain control prn             - I will see her again in 2 weeks to recheck her wound, or sooner if needed.   -- [name], PA-C [facility] 10/04/2020, 3:23 PM [phone] M-F: 7am - 4pm

## 2020-10-04 NOTE — Patient Instructions (Addendum)
   Continue with wet to dry dressing changes.

## 2020-10-10 ENCOUNTER — Telehealth: Payer: Self-pay | Admitting: *Deleted

## 2020-10-10 NOTE — Telephone Encounter (Signed)
Faxed LTD to [employer] at [phone]

## 2020-10-11 ENCOUNTER — Encounter: Payer: Managed Care, Other (non HMO) | Admitting: Obstetrics and Gynecology

## 2020-10-16 ENCOUNTER — Ambulatory Visit (INDEPENDENT_AMBULATORY_CARE_PROVIDER_SITE_OTHER): Payer: Managed Care, Other (non HMO) | Admitting: Physician Assistant

## 2020-10-16 ENCOUNTER — Other Ambulatory Visit: Payer: Self-pay

## 2020-10-16 ENCOUNTER — Encounter: Payer: Self-pay | Admitting: Physician Assistant

## 2020-10-16 VITALS — BP 120/85 | HR 86 | Temp 98.3°F | Ht 64.0 in | Wt 166.0 lb

## 2020-10-16 DIAGNOSIS — Z09 Encounter for follow-up examination after completed treatment for conditions other than malignant neoplasm: Secondary | ICD-10-CM

## 2020-10-16 DIAGNOSIS — T8149XA Infection following a procedure, other surgical site, initial encounter: Secondary | ICD-10-CM

## 2020-10-16 NOTE — Patient Instructions (Signed)
Please see your follow up appointment listed below.  °

## 2020-10-16 NOTE — Progress Notes (Signed)
[facility] POST-OP OFFICE VISIT  10/16/2020  HPI: Diana Whitaker is a 40 y.o. female 28 days s/p incision and drainage of infected umbilical surgical wound with debridement of 4.5 cm of skin and subcutaneous tissue  She had done wel at home with daily dressing changes; however, she noticed in the last 24 hours the wound had become increasingly sore. She looked this morning and thought she had gotten a piece of gauze stuck in the corner of the wound and could not get it removed. No fever, chills. Otherwise doing well. Wound continues to shrink  Vital signs: BP 120/85   Pulse 86   Temp 98.3 F (36.8 C)   Ht 5\' 4"  (1.626 m)   Wt 166 lb (75.3 kg)   SpO2 99%   BMI 28.49 kg/m    Physical Exam: Constitutional: Well appearing female, NAD Skin: Her umbilical wound now measures 3 x 1 x <0.5 cm wound at her umbilicus, the entire wound bed is healthy granulation tissue, no surrounding erythema. In the left lateral corner, it appears a vicryl suture had worked its way to the surface, I was able to remove as much of this as possible.   Assessment/Plan: [name] 28 days s/p incision and drainage of infected umbilical surgical wound with debridement of 4.5 cm of skin and subcutaneous tissue   - Continue superficial wound care, no further packing needed. Reviewed this with her.   - Continue Pain control prn  - Follow up in 2 weeks for wound re-check   -- [name], PA-C [facility] 10/16/2020, 2:43 PM [phone] M-F: 7am - 4pm

## 2020-10-17 ENCOUNTER — Encounter: Payer: Managed Care, Other (non HMO) | Admitting: Physician Assistant

## 2020-10-18 ENCOUNTER — Encounter: Payer: Self-pay | Admitting: General Surgery

## 2020-10-30 ENCOUNTER — Other Ambulatory Visit: Payer: Self-pay

## 2020-10-30 ENCOUNTER — Encounter: Payer: Self-pay | Admitting: Surgery

## 2020-10-30 ENCOUNTER — Ambulatory Visit (INDEPENDENT_AMBULATORY_CARE_PROVIDER_SITE_OTHER): Payer: Managed Care, Other (non HMO) | Admitting: Surgery

## 2020-10-30 VITALS — BP 132/70 | HR 65 | Temp 97.7°F | Ht 64.0 in | Wt 166.8 lb

## 2020-10-30 DIAGNOSIS — Z09 Encounter for follow-up examination after completed treatment for conditions other than malignant neoplasm: Secondary | ICD-10-CM

## 2020-10-30 DIAGNOSIS — S31105D Unspecified open wound of abdominal wall, periumbilic region without penetration into peritoneal cavity, subsequent encounter: Secondary | ICD-10-CM | POA: Diagnosis not present

## 2020-10-30 DIAGNOSIS — B9689 Other specified bacterial agents as the cause of diseases classified elsewhere: Secondary | ICD-10-CM

## 2020-10-30 NOTE — Patient Instructions (Addendum)
Continue to do daily dry dressing changes until the area covers over fully. Try rotating the position of the tape and size of the dressing.     Follow-up with our office in 2 weeks.   Please call and ask to speak with a nurse if you develop questions or concerns.

## 2020-10-30 NOTE — Progress Notes (Signed)
10/30/2020  HPI: Diana Whitaker is a 40 y.o. female s/p umbilical wound exploration and excision of non-viable tissue with Dr. Celine Ahr on 09/18/20.  She initially had a wound vac at the umbilical wound and then transitioned to wet to dry dressing changes.  She presents today for follow up.  She reports that she's been doing better, with improving mobility and pain.  The wound is getting smaller and there's no depth to pack the wound anymore.  Vital signs: BP 132/70   Pulse 65   Temp 97.7 F (36.5 C)   Ht 5\' 4"  (1.626 m)   Wt 166 lb 12.8 oz (75.7 kg)   LMP 08/07/2020 (Approximate)   SpO2 98%   BMI 28.63 kg/m    Physical Exam: Constitutional: No acute distress Abdomen:  Patient has an approximately 1.5-2 cm x 1 cm wound, shallow, with healthy wound bed.  Theres mild serous drainage on the gauze.  No cellulitis or induration.  Applied dry gauze dressing.  Assessment/Plan: [name] s/p umbilical wound exploration  --Patient's wound is healing well thus far.  Continue gauze dressing changes daily.  Offered silver nitrate treatment of the wound but patient deferred for now as the wound is healing well.   --Follow up in 2 weeks for wound check.   Melvyn Neth, [facility]

## 2020-11-01 ENCOUNTER — Encounter: Payer: Managed Care, Other (non HMO) | Admitting: Physician Assistant

## 2020-11-08 ENCOUNTER — Other Ambulatory Visit: Payer: Self-pay

## 2020-11-08 ENCOUNTER — Encounter: Payer: Self-pay | Admitting: Obstetrics and Gynecology

## 2020-11-08 ENCOUNTER — Ambulatory Visit (INDEPENDENT_AMBULATORY_CARE_PROVIDER_SITE_OTHER): Payer: Managed Care, Other (non HMO) | Admitting: Obstetrics and Gynecology

## 2020-11-08 ENCOUNTER — Other Ambulatory Visit (HOSPITAL_COMMUNITY)
Admission: RE | Admit: 2020-11-08 | Discharge: 2020-11-08 | Disposition: A | Discharge status: Home / Self Care | Payer: Managed Care, Other (non HMO) | Source: Ambulatory Visit | Attending: Obstetrics and Gynecology | Admitting: Obstetrics and Gynecology

## 2020-11-08 ENCOUNTER — Telehealth: Payer: Self-pay | Admitting: *Deleted

## 2020-11-08 VITALS — BP 113/74 | HR 72 | Resp 16 | Ht 64.0 in | Wt 167.1 lb

## 2020-11-08 DIAGNOSIS — Z124 Encounter for screening for malignant neoplasm of cervix: Secondary | ICD-10-CM | POA: Diagnosis not present

## 2020-11-08 DIAGNOSIS — Z01419 Encounter for gynecological examination (general) (routine) without abnormal findings: Secondary | ICD-10-CM | POA: Diagnosis not present

## 2020-11-08 DIAGNOSIS — Z23 Encounter for immunization: Secondary | ICD-10-CM | POA: Diagnosis not present

## 2020-11-08 DIAGNOSIS — Z3041 Encounter for surveillance of contraceptive pills: Secondary | ICD-10-CM

## 2020-11-08 DIAGNOSIS — Z1231 Encounter for screening mammogram for malignant neoplasm of breast: Secondary | ICD-10-CM | POA: Diagnosis not present

## 2020-11-08 MED ORDER — LEVONORGEST-ETH ESTRAD 91-DAY 0.15-0.03 &0.01 MG PO TABS: 1.0000 | ORAL_TABLET | Freq: Every day | ORAL | 3 refills | Status: DC

## 2020-11-08 NOTE — Telephone Encounter (Signed)
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

## 2020-11-08 NOTE — Progress Notes (Signed)
HPI:      [name] is a 40 y.o. G1P0 who LMP was Patient's last menstrual period was 09/15/2020 (exact date).  Subjective:   She presents today for her annual examination.  She has no complaints today.  She underwent hernia surgery over the summer and had multiple complications with multiple infections and subsequent surgeries.  Her umbilical wound is now healing by secondary intention and is almost completely healed. She continues to take OCPs and has a very light menstrual period every 3 months.  She is happy with this method would like to continue.    Hx: The following portions of the patient's history were reviewed and updated as appropriate:             She  has a past medical history of Arthritis, Migraines, and Migraines (1999). She does not have any pertinent problems on file. She  has a past surgical history that includes laparoscopy (N/A, 07/18/2020); XI Robotic assisted inguinal hernia repair with mesh (Right, 07/18/2020); Laparoscopic inguinal hernia with umbilical hernia (N/A, 31/54/0086); Wound exploration (N/A, 09/13/2020); and Irrigation and debridement abscess (N/A, 09/18/2020). Her family history is not on file. She was adopted. She  reports that she has never smoked. She has never used smokeless tobacco. She reports that she does not drink alcohol and does not use drugs. She has a current medication list which includes the following prescription(s): valacyclovir, levonorgestrel-ethinyl estradiol, and rizatriptan. She has No Known Allergies.       Review of Systems:  Review of Systems  Constitutional: Denied constitutional symptoms, night sweats, recent illness, fatigue, fever, insomnia and weight loss.  Eyes: Denied eye symptoms, eye pain, photophobia, vision change and visual disturbance.  Ears/Nose/Throat/Neck: Denied ear, nose, throat or neck symptoms, hearing loss, nasal discharge, sinus congestion and sore throat.  Cardiovascular: Denied cardiovascular  symptoms, arrhythmia, chest pain/pressure, edema, exercise intolerance, orthopnea and palpitations.  Respiratory: Denied pulmonary symptoms, asthma, pleuritic pain, productive sputum, cough, dyspnea and wheezing.  Gastrointestinal: Denied, gastro-esophageal reflux, melena, nausea and vomiting.  Genitourinary: Denied genitourinary symptoms including symptomatic vaginal discharge, pelvic relaxation issues, and urinary complaints.  Musculoskeletal: Denied musculoskeletal symptoms, stiffness, swelling, muscle weakness and myalgia.  Dermatologic: Denied dermatology symptoms, rash and scar.  Neurologic: Denied neurology symptoms, dizziness, headache, neck pain and syncope.  Psychiatric: Denied psychiatric symptoms, anxiety and depression.  Endocrine: Denied endocrine symptoms including hot flashes and night sweats.   Meds:   Current Outpatient Medications on File Prior to Visit  Medication Sig Dispense Refill   valACYclovir (VALTREX) 1000 MG tablet Take by mouth.     rizatriptan (MAXALT) 10 MG tablet MAY REPEAT IN 2 HOURS IF NEEDED (Patient taking differently: Take 10 mg by mouth as needed for migraine.) 10 tablet 2   No current facility-administered medications on file prior to visit.       Objective:     Vitals:   11/08/20 0816  BP: 113/74  Pulse: 72  Resp: 16    Filed Weights   11/08/20 0816  Weight: 167 lb 1.6 oz (75.8 kg)              Physical examination General NAD, Conversant  HEENT Atraumatic; Op clear with mmm.  Normo-cephalic. Pupils reactive. Anicteric sclerae  Thyroid/Neck Smooth without nodularity or enlargement. Normal ROM.  Neck Supple.  Skin No rashes, lesions or ulceration. Normal palpated skin turgor. No nodularity.  Breasts: No masses or discharge.  Symmetric.  No axillary adenopathy.  Lungs: Clear to auscultation.No rales or  wheezes. Normal Respiratory effort, no retractions.  Heart: NSR.  No murmurs or rubs appreciated. No periferal edema  Abdomen: Soft.   Non-tender.  No masses.  No HSM. No hernia.  Healing umbilical wound  Extremities: Moves all appropriately.  Normal ROM for age. No lymphadenopathy.  Neuro: Oriented to PPT.  Normal mood. Normal affect.     Pelvic:   Vulva: Normal appearance.  No lesions.  Vagina: No lesions or abnormalities noted.  Support: Normal pelvic support.  Urethra No masses tenderness or scarring.  Meatus Normal size without lesions or prolapse.  Cervix: Normal appearance.  No lesions.  Anus: Normal exam.  No lesions.  Perineum: Normal exam.  No lesions.        Bimanual   Uterus: Normal size.  Non-tender.  Mobile.  AV.  Adnexae: No masses.  Non-tender to palpation.  Cul-de-sac: Negative for abnormality.     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 Well woman exam with routine gynecological exam   2. Cervical cancer screening   3. Encounter for screening mammogram for malignant neoplasm of breast   4. Need for immunization against influenza   5. Uses oral contraception        Plan:            1.  Basic Screening Recommendations The basic screening recommendations for asymptomatic women were discussed with the patient during her visit.  The  age-appropriate recommendations were discussed with her and the rational for the tests reviewed.  When I am informed by the patient that another primary care physician has previously obtained the age-appropriate tests and they are up-to-date, only outstanding tests are ordered and referrals given as necessary.  Abnormal results of tests will be discussed with her when all of her results are completed.  Routine preventative health maintenance measures emphasized: Exercise/Diet/Weight control, Tobacco Warnings, Alcohol/Substance use risks and Stress Management Pap performed -mammogram ordered -lab work ordered 2.  Continue OCPs Orders Orders Placed This Encounter  Procedures   MM 3D SCREEN BREAST BILATERAL   Flu Vaccine QUAD 22mo+IM (Fluarix, Fluzone & Alfiuria Quad PF)   Glucose, random   Lipid panel   CBC   Basic metabolic panel     Meds ordered this encounter  Medications   Levonorgestrel-Ethinyl Estradiol (AMETHIA) 0.15-0.03 &0.01 MG tablet    Sig: Take 1 tablet by mouth daily.    Dispense:  91 tablet    Refill:  3            F/U  Return in about 1 year (around 11/08/2021) for Annual Physical.  Finis Bud, M.D. 11/08/2020 8:57 AM

## 2020-11-09 LAB — BASIC METABOLIC PANEL
BUN/Creatinine Ratio: 15 (ref 9–23)
BUN: 11 mg/dL (ref 6–24)
CO2: 23 mmol/L (ref 20–29)
Calcium: 9 mg/dL (ref 8.7–10.2)
Chloride: 103 mmol/L (ref 96–106)
Creatinine, Ser: 0.72 mg/dL (ref 0.57–1.00)
Glucose: 78 mg/dL (ref 70–99)
Potassium: 4.4 mmol/L (ref 3.5–5.2)
Sodium: 139 mmol/L (ref 134–144)
eGFR: 107 mL/min/{1.73_m2} (ref >59)

## 2020-11-09 LAB — CBC
Hematocrit: 41.1 % (ref 34.0–46.6)
Hemoglobin: 14 g/dL (ref 11.1–15.9)
MCH: 31.4 pg (ref 26.6–33.0)
MCHC: 34.1 g/dL (ref 31.5–35.7)
MCV: 92 fL (ref 79–97)
Platelets: 331 10*3/uL (ref 150–450)
RBC: 4.46 x10E6/uL (ref 3.77–5.28)
RDW: 11.9 % (ref 11.7–15.4)
WBC: 6.5 10*3/uL (ref 3.4–10.8)

## 2020-11-09 LAB — LIPID PANEL
Chol/HDL Ratio: 4.6 ratio — ABNORMAL HIGH (ref 0.0–4.4)
Cholesterol, Total: 143 mg/dL (ref 100–199)
HDL: 31 mg/dL — ABNORMAL LOW (ref >39)
LDL Chol Calc (NIH): 94 mg/dL (ref 0–99)
Triglycerides: 92 mg/dL (ref 0–149)
VLDL Cholesterol Cal: 18 mg/dL (ref 5–40)

## 2020-11-10 LAB — CYTOLOGY - PAP
Comment: NEGATIVE
Diagnosis: UNDETERMINED — AB
High risk HPV: NEGATIVE

## 2020-11-13 ENCOUNTER — Other Ambulatory Visit: Payer: Self-pay

## 2020-11-13 ENCOUNTER — Ambulatory Visit (INDEPENDENT_AMBULATORY_CARE_PROVIDER_SITE_OTHER): Payer: Managed Care, Other (non HMO) | Admitting: Surgery

## 2020-11-13 ENCOUNTER — Encounter: Payer: Self-pay | Admitting: Surgery

## 2020-11-13 VITALS — BP 131/75 | HR 69 | Temp 97.8°F | Wt 167.8 lb

## 2020-11-13 DIAGNOSIS — S31105D Unspecified open wound of abdominal wall, periumbilic region without penetration into peritoneal cavity, subsequent encounter: Secondary | ICD-10-CM

## 2020-11-13 DIAGNOSIS — B9689 Other specified bacterial agents as the cause of diseases classified elsewhere: Secondary | ICD-10-CM | POA: Diagnosis not present

## 2020-11-13 NOTE — Patient Instructions (Addendum)
If you have any concerns or questions, please feel free to call our office. You are currently under no restrictions. You may resume your normal activity as tolerated. See follow up appointment below.

## 2020-11-13 NOTE — Progress Notes (Signed)
11/13/2020  HPI: Diana Whitaker is a 40 y.o. female s/p I&D and debridement of umbilical wound with Dr. Celine Ahr on 09/18/20.  She presents today for follow up.  Has a wound that's been getting dressing changes daily.  Was last seen on 10/30/20.  Reports that the wound continues to get smaller and denies any significant pain.  She's ambulating and is wanting to get back to work next week.  Vital signs: BP 131/75   Pulse 69   Temp 97.8 F (36.6 C) (Oral)   Wt 167 lb 12.8 oz (76.1 kg)   SpO2 99%   BMI 28.80 kg/m    Physical Exam: Constitutional:  No acute distress Abdomen:  Soft, non-distended, non-tender.  Umbilical wound is healing well, with healthy wound bed, measuring now about 8-10 mm x 4 mm.  Silver nitrate applied and dressed with dry gauze dressing.  Assessment/Plan: This is a 41 y.o. female s/p umbilical wound I&D and debridement.  --Patient is healing well and the wound is almost fully healed.  Silver nitrate applied. --Will give patient work note so she can get back to work, no restrictions. --Follow up in 1 week for likely last wound check.   Melvyn Neth, [facility]

## 2020-11-14 NOTE — Progress Notes (Signed)
This can be considered a negative Pap smear.  Although there seem to be some abnormal cells, your HPV is negative and that is what makes the overall Pap negative.  Just to be on the safe side I would consider repeating a Pap smear in 1 year with your next exam.

## 2020-11-20 ENCOUNTER — Other Ambulatory Visit: Payer: Self-pay

## 2020-11-20 ENCOUNTER — Ambulatory Visit (INDEPENDENT_AMBULATORY_CARE_PROVIDER_SITE_OTHER): Payer: Managed Care, Other (non HMO) | Admitting: Surgery

## 2020-11-20 ENCOUNTER — Encounter: Payer: Self-pay | Admitting: Surgery

## 2020-11-20 VITALS — BP 116/71 | HR 77 | Temp 97.9°F | Wt 163.6 lb

## 2020-11-20 DIAGNOSIS — S31105D Unspecified open wound of abdominal wall, periumbilic region without penetration into peritoneal cavity, subsequent encounter: Secondary | ICD-10-CM

## 2020-11-20 NOTE — Patient Instructions (Signed)
If you have any concerns or questions, please feel free to call our office. Follow up as needed.  

## 2020-11-20 NOTE — Progress Notes (Signed)
11/20/2020  HPI: TONANTZIN MIMNAUGH is a 40 y.o. female s/p I&D and debridement of umbilical wound with Dr. Celine Ahr on 09/18/20.  She presents today for follow up.  She was last seen on 11/13/2020.  At that point her wound continues to heal well and silver nitrate was applied.  Today she reports that the wound is almost healed and has not had any complications.  She did have a little bit of bleeding when the gauze pulled and was still attached to the wound.  Vital signs: BP 116/71   Pulse 77   Temp 97.9 F (36.6 C) (Oral)   Wt 163 lb 9.6 oz (74.2 kg)   SpO2 97%   BMI 28.08 kg/m    Physical Exam: Constitutional: No acute distress Abdomen: Umbilical wound is almost healed now measuring only 3 x 1 mm in size with a thin layer of skin covering.  No active drainage.  Dressed with dry gauze and Tegaderm.  Assessment/Plan: [name] s/p I&D and debridement of umbilical wound.  - Discussed with the patient that the wound is almost healed now.  No silver nitrate is needed at this point.  Discussed with her that she can continue dry gauze dressing until the wound is fully healed to help also with padding so that her pants and waistline do not hit the wound or rubbing against it.  She may apply triple antibiotic over the wound if she wants to keep it somewhat moist so the gauze does not stick to the wound as much. - Follow-up as needed.   Melvyn Neth, [facility]

## 2020-12-22 ENCOUNTER — Telehealth: Payer: Self-pay

## 2020-12-22 MED ORDER — AMOXICILLIN-POT CLAVULANATE 875-125 MG PO TABS: 1.0000 | ORAL_TABLET | Freq: Two times a day (BID) | ORAL | 0 refills | Status: AC

## 2020-12-22 NOTE — Telephone Encounter (Signed)
Left detailed message for patient to pick up antibiotic at pharmacy today and an appointment was scheduled 12/25/20 @ 4 pm with [place] office.

## 2020-12-25 ENCOUNTER — Ambulatory Visit: Payer: Managed Care, Other (non HMO) | Admitting: Surgery

## 2020-12-26 ENCOUNTER — Other Ambulatory Visit: Payer: Self-pay

## 2020-12-26 ENCOUNTER — Ambulatory Visit
Admission: RE | Admit: 2020-12-26 | Discharge: 2020-12-26 | Disposition: A | Discharge status: Home / Self Care | Payer: Managed Care, Other (non HMO) | Source: Ambulatory Visit | Attending: Obstetrics and Gynecology | Admitting: Obstetrics and Gynecology

## 2020-12-26 DIAGNOSIS — Z1231 Encounter for screening mammogram for malignant neoplasm of breast: Secondary | ICD-10-CM | POA: Diagnosis present

## 2021-06-04 DIAGNOSIS — S93402A Sprain of unspecified ligament of left ankle, initial encounter: Secondary | ICD-10-CM | POA: Insufficient documentation

## 2021-10-31 ENCOUNTER — Other Ambulatory Visit: Payer: Self-pay | Admitting: Obstetrics and Gynecology

## 2021-10-31 DIAGNOSIS — Z3041 Encounter for surveillance of contraceptive pills: Secondary | ICD-10-CM

## 2021-11-08 ENCOUNTER — Encounter: Payer: Managed Care, Other (non HMO) | Admitting: Obstetrics and Gynecology

## 2021-11-12 ENCOUNTER — Ambulatory Visit: Payer: Managed Care, Other (non HMO) | Admitting: Family

## 2021-11-12 ENCOUNTER — Encounter: Payer: Self-pay | Admitting: Family

## 2021-11-12 VITALS — BP 132/78 | HR 79 | Temp 98.2°F | Resp 16 | Ht 63.5 in | Wt 172.0 lb

## 2021-11-12 DIAGNOSIS — K5909 Other constipation: Secondary | ICD-10-CM

## 2021-11-12 DIAGNOSIS — Z13220 Encounter for screening for lipoid disorders: Secondary | ICD-10-CM

## 2021-11-12 DIAGNOSIS — G540 Brachial plexus disorders: Secondary | ICD-10-CM

## 2021-11-12 DIAGNOSIS — G43839 Menstrual migraine, intractable, without status migrainosus: Secondary | ICD-10-CM | POA: Diagnosis not present

## 2021-11-12 DIAGNOSIS — F411 Generalized anxiety disorder: Secondary | ICD-10-CM | POA: Diagnosis not present

## 2021-11-12 DIAGNOSIS — Z0000 Encounter for general adult medical examination without abnormal findings: Secondary | ICD-10-CM

## 2021-11-12 DIAGNOSIS — R221 Localized swelling, mass and lump, neck: Secondary | ICD-10-CM

## 2021-11-12 DIAGNOSIS — E559 Vitamin D deficiency, unspecified: Secondary | ICD-10-CM | POA: Diagnosis not present

## 2021-11-12 DIAGNOSIS — Z23 Encounter for immunization: Secondary | ICD-10-CM | POA: Diagnosis not present

## 2021-11-12 MED ORDER — ESCITALOPRAM OXALATE 10 MG PO TABS: 10.0000 mg | ORAL_TABLET | Freq: Every day | ORAL | 1 refills | Status: DC

## 2021-11-12 MED ORDER — RIZATRIPTAN BENZOATE 10 MG PO TABS: 10.0000 mg | ORAL_TABLET | ORAL | 0 refills | Status: DC | PRN

## 2021-11-12 NOTE — Progress Notes (Signed)
New Patient Office Visit  Subjective:  Patient ID: Diana Whitaker, female    DOB: 1980/10/31  Age: 41 y.o. MRN: [id number]  CC:  Chief Complaint  Patient presents with  . Transitions Of Care    HPI Diana Whitaker is here to establish care as a new patient as well as here for annual physical exam.   Prior provider was: theresa clapp, in 2021 was with Tor Netters, FNP.  Pt is without acute concerns.  Pap: repeat ASCUS pap scheduled next week with GYN.  Mammo due for December, last 12/22.  Flu vaccine, will get today.   Noticed a 'hump' on the back of her neck, feels more prominent nad sometimes tender. Does contribute at times to poor posture. Sometimes will need to push 'and pop bones' with some mild improvement. Tried to see chiropractor without much relief.   Wt Readings from Last 3 Encounters:  11/14/21 172 lb (78 kg)  11/12/21 172 lb (78 kg)  11/20/20 163 lb 9.6 oz (49.6 kg)   H/o umbilical and ventral hernia repair 8/22. Healed completely no concerns.   chronic concerns:  Migraines: rizatriptan as needed. Finds they are typically 2-3 times a month, last for about 1-4 days. It is intermittent in terms of if responsive to maxalt.   Cold sores: sometimes due to stress, takes valtrex prn outbreak. < 10 cold sores per year. Just oral labial   GAD: stressors are causing her to worry more in the last few months and increasing headaches with more cold sores. Constant 'fear' and is worrying a lot more often. She doesn't see a therapist currently, but is considering this.     11/12/2021    3:09 PM 11/08/2020    8:35 AM 10/08/2019   11:58 AM  PHQ9 SCORE ONLY  PHQ-9 Total Score 2 0 0      Past Medical History:  Diagnosis Date  . Migraines 1999    Past Surgical History:  Procedure Laterality Date  . IRRIGATION AND DEBRIDEMENT ABSCESS N/A 09/18/2020   Procedure: IRRIGATION AND DEBRIDEMENT ABSCESS;  Surgeon: Fredirick Maudlin, MD;  Location: [facility];  Service: General;   Laterality: N/A;  . LAPAROSCOPIC INGUINAL HERNIA WITH UMBILICAL HERNIA N/A 75/91/6384   with mesh  . LAPAROSCOPY N/A 07/18/2020   Procedure: LAPAROSCOPY DIAGNOSTIC;  Surgeon: Olean Ree, MD;  Location: [facility];  Service: General;  Laterality: N/A;  . WOUND EXPLORATION N/A 09/13/2020   Procedure: WOUND EXPLORATION;  Surgeon: Olean Ree, MD;  Location: [facility];  Service: General;  Laterality: N/A;  Umbilical wound exploration  . XI ROBOTIC ASSISTED INGUINAL HERNIA REPAIR WITH MESH Right 07/18/2020   Procedure: XI ROBOTIC ASSISTED INGUINAL HERNIA REPAIR WITH MESH;  Surgeon: Olean Ree, MD;  Location: [facility];  Service: General;  Laterality: Right;    Family History  Adopted: Yes  Problem Relation Age of Onset  . Kidney disease Mother   . Skin cancer Mother   . Blindness Maternal Grandmother   . Heart attack Maternal Grandmother   . Hypertension Maternal Grandfather   . Heart attack Maternal Grandfather   . Other Son        Tetrasomy 9p  . Breast cancer Neg Hx     Social History   Socioeconomic History  . Marital status: Married    Spouse name: Not on file  . Number of children: Not on file  . Years of education: Not on file  . Highest education level: Not on file  Occupational History  .  Not on file  Tobacco Use  . Smoking status: Never  . Smokeless tobacco: Never  Vaping Use  . Vaping Use: Never used  Substance and Sexual Activity  . Alcohol use: No  . Drug use: No  . Sexual activity: Yes    Partners: Male    Birth control/protection: Pill  Other Topics Concern  . Not on file  Social History Narrative   Son with rare genetic diseaseTetrasomy 9p   Social Determinants of Health   Financial Resource Strain: Not on file  Food Insecurity: Not on file  Transportation Needs: Not on file  Physical Activity: Not on file  Stress: Not on file  Social Connections: Not on file  Intimate Partner Violence: Not on file    Outpatient Medications Prior to Visit   Medication Sig Dispense Refill  . valACYclovir (VALTREX) 1000 MG tablet Take by mouth.    . Levonorgestrel-Ethinyl Estradiol (SIMPESSE) 0.15-0.03 &0.01 MG tablet TAKE 1 TABLET BY MOUTH EVERY DAY 91 tablet 0  . rizatriptan (MAXALT) 10 MG tablet MAY REPEAT IN 2 HOURS IF NEEDED (Patient taking differently: Take 10 mg by mouth as needed for migraine.) 10 tablet 2   No facility-administered medications prior to visit.    No Known Allergies  ROS Review of Systems  Constitutional:  Negative for chills and fatigue.  Respiratory:  Negative for cough and shortness of breath.   Cardiovascular:  Negative for chest pain and leg swelling.  Gastrointestinal:  Negative for diarrhea and nausea.  Genitourinary:  Negative for difficulty urinating, menstrual problem and vaginal bleeding.  Musculoskeletal:        Nontender palpable non pulsable neck mass on posterior base of neck.   Neurological:  Negative for dizziness and headaches.  Psychiatric/Behavioral:  Negative for agitation, sleep disturbance and suicidal ideas. The patient is nervous/anxious (increased stressors recently).   All other systems reviewed and are negative.       Objective:    Physical Exam Vitals reviewed.  Constitutional:      General: She is not in acute distress.    Appearance: Normal appearance. She is not ill-appearing or toxic-appearing.  HENT:     Right Ear: Tympanic membrane normal.     Left Ear: Tympanic membrane normal.     Mouth/Throat:     Mouth: Mucous membranes are moist.     Pharynx: No pharyngeal swelling.     Tonsils: No tonsillar exudate.  Eyes:     Extraocular Movements: Extraocular movements intact.     Conjunctiva/sclera: Conjunctivae normal.     Pupils: Pupils are equal, round, and reactive to light.  Neck:     Thyroid: No thyroid mass.     Comments: Soft lump on posterior middle of neck , fixed. Non-tender. No surrounding redness or warmth. Approximately 2 cm in diameter Cardiovascular:      Rate and Rhythm: Normal rate and regular rhythm.  Pulmonary:     Effort: Pulmonary effort is normal.     Breath sounds: Normal breath sounds.  Abdominal:     General: Abdomen is flat. Bowel sounds are normal.     Palpations: Abdomen is soft.  Musculoskeletal:        General: Normal range of motion.  Lymphadenopathy:     Cervical:     Right cervical: No superficial cervical adenopathy.    Left cervical: No superficial cervical adenopathy.  Skin:    General: Skin is warm.     Capillary Refill: Capillary refill takes less than 2 seconds.  Neurological:     General: No focal deficit present.     Mental Status: She is alert and oriented to person, place, and time.  Psychiatric:        Mood and Affect: Mood normal.        Behavior: Behavior normal.        Thought Content: Thought content normal.        Judgment: Judgment normal.    Waist measurement- 36 cm     BP 132/78   Pulse 79   Temp 98.2 F (36.8 C)   Resp 16   Ht 5' 3.5" (1.613 m)   Wt 172 lb (78 kg)   SpO2 98%   BMI 29.99 kg/m  Wt Readings from Last 3 Encounters:  11/14/21 172 lb (78 kg)  11/12/21 172 lb (78 kg)  11/20/20 163 lb 9.6 oz (74.2 kg)     Health Maintenance Due  Topic Date Due  . COVID-19 Vaccine (4 - Moderna series) 04/16/2019    There are no preventive care reminders to display for this patient.  Lab Results  Component Value Date   TSH 1.57 11/12/2021   Lab Results  Component Value Date   WBC 9.7 11/12/2021   HGB 13.7 11/12/2021   HCT 41.1 11/12/2021   MCV 93.0 11/12/2021   PLT 351.0 11/12/2021   Lab Results  Component Value Date   NA 139 11/12/2021   K 3.9 11/12/2021   CO2 30 11/12/2021   GLUCOSE 99 11/12/2021   BUN 10 11/12/2021   CREATININE 0.68 11/12/2021   BILITOT 0.3 11/12/2021   ALKPHOS 70 11/12/2021   AST 16 11/12/2021   ALT 13 11/12/2021   PROT 6.9 11/12/2021   ALBUMIN 4.2 11/12/2021   CALCIUM 9.4 11/12/2021   ANIONGAP 8 09/18/2020   EGFR 107 11/08/2020   GFR  106.42 11/12/2021   Lab Results  Component Value Date   CHOL 130 11/12/2021   Lab Results  Component Value Date   HDL 36.10 (L) 11/12/2021   Lab Results  Component Value Date   LDLCALC 72 11/12/2021   Lab Results  Component Value Date   TRIG 106.0 11/12/2021   Lab Results  Component Value Date   CHOLHDL 4 11/12/2021   Lab Results  Component Value Date   HGBA1C 5.0 10/07/2018      Assessment & Plan:   Problem List Items Addressed This Visit       Cardiovascular and Mediastinum   Intractable menstrual migraine without status migrainosus    refil maxalt 10 mg  Recommended otc supplementation Keep headache diary as necessary Avoid triggers as able      Relevant Medications   rizatriptan (MAXALT) 10 MG tablet   escitalopram (LEXAPRO) 10 MG tablet   Other Relevant Orders   TSH (Completed)     Digestive   Chronic constipation     Nervous and Auditory   Brachial plexopathy    Ordering b12 to see if this is contributing.      Relevant Medications   escitalopram (LEXAPRO) 10 MG tablet   Other Relevant Orders   Vitamin B12 (Completed)     Other   GAD (generalized anxiety disorder)    Worsening  I instructed pt to start lexapro 1/2 tablet once daily for 1 week and then increase to a full tablet once daily on week two as tolerated.  We discussed common side effects such as nausea, drowsiness and weight gain.  Also discussed rare but serious side effect of  suicidal ideation.  She is instructed to discontinue medication and go directly to ED if this occurs.  Pt verbalizes understanding.  Plan is to follow up in 30 days to evaluate progress.     Referral placed for therapy       Relevant Medications   escitalopram (LEXAPRO) 10 MG tablet   Other Relevant Orders   Ambulatory referral to Psychology   CBC (Completed)   TSH (Completed)   Palpable mass of neck    Ct soft tissue neck ordered Suspected lipoma      Relevant Orders   CT SOFT TISSUE NECK W  CONTRAST   Encounter for general adult medical examination without abnormal findings - Primary    Patient Counseling(The following topics were reviewed):  Preventative care handout given to pt  Health maintenance and immunizations reviewed. Please refer to Health maintenance section. Pt advised on safe sex, wearing seatbelts in car, and proper nutrition labwork ordered today for annual Dental health: Discussed importance of regular tooth brushing, flossing, and dental visits.  Flu vaccine administered in visit        Relevant Orders   Comprehensive metabolic panel (Completed)   Vitamin D deficiency   Relevant Orders   VITAMIN D 25 Hydroxy (Vit-D Deficiency, Fractures) (Completed)   RESOLVED: Encounter for vaccination   Other Visit Diagnoses     Screening for lipoid disorders       Relevant Orders   Lipid panel (Completed)   Need for influenza vaccination       Relevant Orders   Flu Vaccine QUAD 6+ mos PF IM (Fluarix Quad PF) (Completed)       Meds ordered this encounter  Medications  . rizatriptan (MAXALT) 10 MG tablet    Sig: Take 1 tablet (10 mg total) by mouth as needed for migraine.    Dispense:  10 tablet    Refill:  0    Order Specific Question:   Supervising Provider    Answer:   BEDSOLE, AMY E [[id number]]  . escitalopram (LEXAPRO) 10 MG tablet    Sig: Take 1 tablet (10 mg total) by mouth daily.    Dispense:  30 tablet    Refill:  1    Order Specific Question:   Supervising Provider    Answer:   Diona Browner, AMY E [[id number]]    Follow-up: Return in about 1 month (around 12/13/2021) for f/u anxiety.    Eugenia Pancoast, FNP

## 2021-11-12 NOTE — Patient Instructions (Addendum)
------------------------------------ A referral was placed today. Please let us know if you have not heard back within 2 weeks about the referral.  ------------------------------------ Start lexapro 10 mg for anxiety and depression. Take 1/2 tablet by mouth once daily for about one week, then increase to 1 full tablet thereafter.   Taking the medicine as directed and not missing any doses is one of the best things you can do to treat your GAD.Here are some things to keep in mind:  Side effects (stomach upset, some increased anxiety) may happen before you notice a benefit.  These side effects typically go away over time. Changes to your dose of medicine or a change in medication all together is sometimes necessary Many people will notice an improvement within two weeks but the full effect of the medication can take up to 4-6 weeks Stopping the medication when you start feeling better often results in a return of symptoms. Most people need to be on medication at least 6-12 months If you start having thoughts of hurting yourself or others after starting this medicine, please call me immediately.    ------------------------------------  MIGRAINE RECOMMENDATIONS TO CONSIDER  1. Limit use of pain relievers to no more than 2 days out of the week.  These medications include acetaminophen, NSAIDs (ibuprofen/Advil/Motrin, naproxen/Aleve, triptans (Imitrex/sumatriptan), Excedrin, and narcotics.  This will help reduce risk of rebound headaches. 2. Be aware of common food triggers:             - Caffeine:  coffee, black tea, cola, Mt. Dew             - Chocolate             - Dairy:  aged cheeses (brie, blue, cheddar, gouda, [place], provolone, [place], Swiss, etc), chocolate milk, buttermilk, sour cream, limit eggs and yogurt             - Nuts, peanut butter             - Alcohol             - Cereals/grains:  FRESH breads (fresh bagels, sourdough, doughnuts), yeast productions             -  Processed/canned/aged/cured meats (pre-packaged deli meats, hotdogs)             - MSG/glutamate:  soy sauce, flavor enhancer, pickled/preserved/marinated foods             - Sweeteners:  aspartame (Equal, Nutrasweet).  Sugar and Splenda are okay             - Vegetables:  legumes (lima beans, lentils, snow peas, fava beans, pinto peans, peas, garbanzo beans), sauerkraut, onions, olives, pickles             - Fruit:  avocados, bananas, citrus fruit (orange, lemon, grapefruit), mango             - Other:  Frozen meals, macaroni and cheese 7. Routine exercise 8. Stay adequately hydrated (aim for 64 oz water daily) 9. Keep headache diary 10. Maintain proper stress management 11. Maintain proper sleep hygiene 12. Do not skip meals 13. Consider supplements:  magnesium citrate '400mg'$  daily, riboflavin '400mg'$  daily, coenzyme Q10 '100mg'$  three times daily.    ------------------------------------  Welcome to our clinic, I am happy to have you as my new patient. I am excited to continue on this healthcare journey with you.  Stop by the lab prior to leaving today. I will notify you of your  results once received.   Please keep in mind Any my chart messages you send have up to a three business day turnaround for a response.  Phone calls may take up to a one full business day turnaround for a  response.   If you need a medication refill I recommend you request it through the pharmacy as this is easiest for [place] rather than sending a message and or phone call.   Due to recent changes in healthcare laws, you may see results of your imaging and/or laboratory studies on MyChart before I have had a chance to review them.  I understand that in some cases there may be results that are confusing or concerning to you. Please understand that not all results are received at the same time and often I may need to interpret multiple results in order to provide you with the best plan of care or course of treatment.  Therefore, I ask that you please give me 2 business days to thoroughly review all your results before contacting my office for clarification. Should we see a critical lab result, you will be contacted sooner.   It was a pleasure seeing you today! Please do not hesitate to reach out with any questions and or concerns.  Regards,   Eugenia Pancoast FNP-C

## 2021-11-13 ENCOUNTER — Encounter: Payer: Self-pay | Admitting: Family

## 2021-11-13 LAB — COMPREHENSIVE METABOLIC PANEL
ALT: 13 U/L (ref 0–35)
AST: 16 U/L (ref 0–37)
Albumin: 4.2 g/dL (ref 3.5–5.2)
Alkaline Phosphatase: 70 U/L (ref 39–117)
BUN: 10 mg/dL (ref 6–23)
CO2: 30 mEq/L (ref 19–32)
Calcium: 9.4 mg/dL (ref 8.4–10.5)
Chloride: 102 mEq/L (ref 96–112)
Creatinine, Ser: 0.68 mg/dL (ref 0.40–1.20)
GFR: 106.42 mL/min (ref >60.00)
Glucose, Bld: 99 mg/dL (ref 70–99)
Potassium: 3.9 mEq/L (ref 3.5–5.1)
Sodium: 139 mEq/L (ref 135–145)
Total Bilirubin: 0.3 mg/dL (ref 0.2–1.2)
Total Protein: 6.9 g/dL (ref 6.0–8.3)

## 2021-11-13 LAB — LIPID PANEL
Cholesterol: 130 mg/dL (ref 0–200)
HDL: 36.1 mg/dL — ABNORMAL LOW (ref >39.00)
LDL Cholesterol: 72 mg/dL (ref 0–99)
NonHDL: 93.67
Total CHOL/HDL Ratio: 4
Triglycerides: 106 mg/dL (ref 0.0–149.0)
VLDL: 21.2 mg/dL (ref 0.0–40.0)

## 2021-11-13 LAB — CBC
HCT: 41.1 % (ref 36.0–46.0)
Hemoglobin: 13.7 g/dL (ref 12.0–15.0)
MCHC: 33.5 g/dL (ref 30.0–36.0)
MCV: 93 fl (ref 78.0–100.0)
Platelets: 351 10*3/uL (ref 150.0–400.0)
RBC: 4.41 Mil/uL (ref 3.87–5.11)
RDW: 12.6 % (ref 11.5–15.5)
WBC: 9.7 10*3/uL (ref 4.0–10.5)

## 2021-11-13 LAB — TSH: TSH: 1.57 u[IU]/mL (ref 0.35–5.50)

## 2021-11-13 LAB — VITAMIN B12: Vitamin B-12: 195 pg/mL — ABNORMAL LOW (ref 211–911)

## 2021-11-13 LAB — VITAMIN D 25 HYDROXY (VIT D DEFICIENCY, FRACTURES): VITD: 40.37 ng/mL (ref 30.00–100.00)

## 2021-11-13 NOTE — Progress Notes (Signed)
Vitamin B12 very low, please set up for B12 injections, 1000 mcg IM once monthly for three months, also schedule 3 month f/u appt to repeat labs and f/u in office. Recommend also oral otc 1000 mcg once daily vitamin B12  Labs otherwise ok

## 2021-11-14 ENCOUNTER — Other Ambulatory Visit (HOSPITAL_COMMUNITY)
Admission: RE | Admit: 2021-11-14 | Discharge: 2021-11-14 | Disposition: A | Discharge status: Home / Self Care | Payer: Managed Care, Other (non HMO) | Source: Ambulatory Visit | Attending: Obstetrics and Gynecology | Admitting: Obstetrics and Gynecology

## 2021-11-14 ENCOUNTER — Ambulatory Visit: Payer: Managed Care, Other (non HMO) | Admitting: Obstetrics and Gynecology

## 2021-11-14 ENCOUNTER — Encounter: Payer: Self-pay | Admitting: Obstetrics and Gynecology

## 2021-11-14 ENCOUNTER — Encounter: Payer: Self-pay | Admitting: Family

## 2021-11-14 ENCOUNTER — Telehealth: Payer: Self-pay | Admitting: Family

## 2021-11-14 VITALS — BP 120/77 | HR 72 | Ht 64.0 in | Wt 172.0 lb

## 2021-11-14 DIAGNOSIS — Z124 Encounter for screening for malignant neoplasm of cervix: Secondary | ICD-10-CM | POA: Diagnosis present

## 2021-11-14 DIAGNOSIS — Z3041 Encounter for surveillance of contraceptive pills: Secondary | ICD-10-CM

## 2021-11-14 DIAGNOSIS — Z01419 Encounter for gynecological examination (general) (routine) without abnormal findings: Secondary | ICD-10-CM | POA: Insufficient documentation

## 2021-11-14 DIAGNOSIS — Z1231 Encounter for screening mammogram for malignant neoplasm of breast: Secondary | ICD-10-CM

## 2021-11-14 MED ORDER — LEVONORGEST-ETH ESTRAD 91-DAY 0.15-0.03 &0.01 MG PO TABS: 1.0000 | ORAL_TABLET | Freq: Every day | ORAL | 0 refills | Status: DC

## 2021-11-14 NOTE — Telephone Encounter (Signed)
Called and spoke to pt. She just needs forms filled out by the end of the month. Flu vaccine is on back of the forms.

## 2021-11-14 NOTE — Telephone Encounter (Signed)
Patient came in office and dropped off Wellness Screening form for PCP to fill out and has been placed in PCP folder. Also she request a copy of her flu shot record. Call back number [phone]

## 2021-11-14 NOTE — Progress Notes (Signed)
HPI:      [name] is a 41 y.o. G1P0 who LMP was No LMP recorded (lmp unknown). (Menstrual status: Oral contraceptives).  Subjective:   She presents today for her annual examination.  She has no complaints.  She is on 49-monthOCPs.  She occasionally skips cycles but does not skip pills.  She would like to continue on OCPs. Her Pap last year showed ASCUS but negative HPV.  Planning repeat this year. She is due for mammography. She was recently started on Lexapro for anxiety but she has not begun her prescription yet.    Hx: The following portions of the patient's history were reviewed and updated as appropriate:             She  has a past medical history of Migraines (1999). She does not have any pertinent problems on file. She  has a past surgical history that includes laparoscopy (N/A, 07/18/2020); XI Robotic assisted inguinal hernia repair with mesh (Right, 07/18/2020); Laparoscopic inguinal hernia with umbilical hernia (N/A, 096/29/5284; Wound exploration (N/A, 09/13/2020); and Irrigation and debridement abscess (N/A, 09/18/2020). Her family history includes Blindness in her maternal grandmother; Heart attack in her maternal grandfather and maternal grandmother; Hypertension in her maternal grandfather; Kidney disease in her mother; Other in her son; Skin cancer in her mother. She was adopted. She  reports that she has never smoked. She has never used smokeless tobacco. She reports that she does not drink alcohol and does not use drugs. She has a current medication list which includes the following prescription(s): escitalopram, rizatriptan, valacyclovir, and levonorgestrel-ethinyl estradiol. She has No Known Allergies.       Review of Systems:  Review of Systems  Constitutional: Denied constitutional symptoms, night sweats, recent illness, fatigue, fever, insomnia and weight loss.  Eyes: Denied eye symptoms, eye pain, photophobia, vision change and visual disturbance.   Ears/Nose/Throat/Neck: Denied ear, nose, throat or neck symptoms, hearing loss, nasal discharge, sinus congestion and sore throat.  Cardiovascular: Denied cardiovascular symptoms, arrhythmia, chest pain/pressure, edema, exercise intolerance, orthopnea and palpitations.  Respiratory: Denied pulmonary symptoms, asthma, pleuritic pain, productive sputum, cough, dyspnea and wheezing.  Gastrointestinal: Denied, gastro-esophageal reflux, melena, nausea and vomiting.  Genitourinary: Denied genitourinary symptoms including symptomatic vaginal discharge, pelvic relaxation issues, and urinary complaints.  Musculoskeletal: Denied musculoskeletal symptoms, stiffness, swelling, muscle weakness and myalgia.  Dermatologic: Denied dermatology symptoms, rash and scar.  Neurologic: Denied neurology symptoms, dizziness, headache, neck pain and syncope.  Psychiatric: Denied psychiatric symptoms, anxiety and depression.  Endocrine: Denied endocrine symptoms including hot flashes and night sweats.   Meds:   Current Outpatient Medications on File Prior to Visit  Medication Sig Dispense Refill   escitalopram (LEXAPRO) 10 MG tablet Take 1 tablet (10 mg total) by mouth daily. 30 tablet 1   rizatriptan (MAXALT) 10 MG tablet Take 1 tablet (10 mg total) by mouth as needed for migraine. 10 tablet 0   valACYclovir (VALTREX) 1000 MG tablet Take by mouth.     No current facility-administered medications on file prior to visit.     Objective:     Vitals:   11/14/21 1445  BP: 120/77  Pulse: 72    Filed Weights   11/14/21 1445  Weight: 172 lb (78 kg)              Physical examination General NAD, Conversant  HEENT Atraumatic; Op clear with mmm.  Normo-cephalic. Pupils reactive. Anicteric sclerae  Thyroid/Neck Smooth without nodularity or enlargement. Normal ROM.  Neck  Supple.  Skin No rashes, lesions or ulceration. Normal palpated skin turgor. No nodularity.  Breasts: No masses or discharge.  Symmetric.  No  axillary adenopathy.  Lungs: Clear to auscultation.No rales or wheezes. Normal Respiratory effort, no retractions.  Heart: NSR.  No murmurs or rubs appreciated. No periferal edema  Abdomen: Soft.  Non-tender.  No masses.  No HSM. No hernia  Extremities: Moves all appropriately.  Normal ROM for age. No lymphadenopathy.  Neuro: Oriented to PPT.  Normal mood. Normal affect.     Pelvic:   Vulva: Normal appearance.  No lesions.  Vagina: No lesions or abnormalities noted.  Support: Normal pelvic support.  Urethra No masses tenderness or scarring.  Meatus Normal size without lesions or prolapse.  Cervix: Normal appearance.  No lesions.  Anus: Normal exam.  No lesions.  Perineum: Normal exam.  No lesions.        Bimanual   Uterus: Normal size.  Non-tender.  Mobile.  AV.  Adnexae: No masses.  Non-tender to palpation.  Cul-de-sac: Negative for abnormality.     Assessment:    G1P0 Patient Active Problem List   Diagnosis Date Noted   Encounter for vaccination 11/12/2021   Intractable migraine with aura with status migrainosus 02/15/2020   Cervical spine pain 08/12/2019   Brachial plexopathy 01/05/2018   Chronic constipation 08/30/2015   Overweight (BMI 25.0-29.9) 08/30/2015     1. Well woman exam with routine gynecological exam   2. Cervical cancer screening   3. Screening mammogram for breast cancer   4. Uses oral contraception        Plan:            1.  Basic Screening Recommendations The basic screening recommendations for asymptomatic women were discussed with the patient during her visit.  The age-appropriate recommendations were discussed with her and the rational for the tests reviewed.  When I am informed by the patient that another primary care physician has previously obtained the age-appropriate tests and they are up-to-date, only outstanding tests are ordered and referrals given as necessary.  Abnormal results of tests will be discussed with her when all of her  results are completed.  Routine preventative health maintenance measures emphasized: Exercise/Diet/Weight control, Tobacco Warnings, Alcohol/Substance use risks and Stress Management Pap cotest performed, mammogram ordered 2.  Reassured patient regarding no menses with correct use of OCPs.  She does not desire a change in pills.  Orders No orders of the defined types were placed in this encounter.    Meds ordered this encounter  Medications   Levonorgestrel-Ethinyl Estradiol (SIMPESSE) 0.15-0.03 &0.01 MG tablet    Sig: Take 1 tablet by mouth daily.    Dispense:  91 tablet    Refill:  0          F/U  Return in about 1 year (around 11/15/2022) for Annual Physical.  Finis Bud, M.D. 11/14/2021 3:38 PM

## 2021-11-14 NOTE — Progress Notes (Signed)
Patients presents for annual exam today. She states this past year having spotting to a minimal cycle while on 90 day OCP's. Patient is due for pap smear, ordered. Due for mammogram, ordered. Annual labs declined, done with PCP recently.Patient states no other questions or concerns at this time.

## 2021-11-15 DIAGNOSIS — R221 Localized swelling, mass and lump, neck: Secondary | ICD-10-CM | POA: Insufficient documentation

## 2021-11-15 DIAGNOSIS — Z0000 Encounter for general adult medical examination without abnormal findings: Secondary | ICD-10-CM | POA: Insufficient documentation

## 2021-11-15 DIAGNOSIS — E559 Vitamin D deficiency, unspecified: Secondary | ICD-10-CM | POA: Insufficient documentation

## 2021-11-15 NOTE — Assessment & Plan Note (Addendum)
Worsening  I instructed pt to start lexapro 1/2 tablet once daily for 1 week and then increase to a full tablet once daily on week two as tolerated.  We discussed common side effects such as nausea, drowsiness and weight gain.  Also discussed rare but serious side effect of suicidal ideation.  She is instructed to discontinue medication and go directly to ED if this occurs.  Pt verbalizes understanding.  Plan is to follow up in 30 days to evaluate progress.     Referral placed for therapy

## 2021-11-15 NOTE — Assessment & Plan Note (Signed)
refil maxalt 10 mg  Recommended otc supplementation Keep headache diary as necessary Avoid triggers as able

## 2021-11-15 NOTE — Telephone Encounter (Signed)
Pt returned call will come by the  office to pick up paper work

## 2021-11-15 NOTE — Telephone Encounter (Signed)
Papers placed in pcp box and Flu vaccine but with the paperwork.

## 2021-11-15 NOTE — Telephone Encounter (Signed)
Left message to return call to our office.  

## 2021-11-15 NOTE — Assessment & Plan Note (Signed)
Ct soft tissue neck ordered Suspected lipoma

## 2021-11-15 NOTE — Telephone Encounter (Signed)
Form completed kelly, in my outbox.

## 2021-11-15 NOTE — Assessment & Plan Note (Addendum)
Patient Counseling(The following topics were reviewed):  Preventative care handout given to pt  Health maintenance and immunizations reviewed. Please refer to Health maintenance section. Pt advised on safe sex, wearing seatbelts in car, and proper nutrition labwork ordered today for annual Dental health: Discussed importance of regular tooth brushing, flossing, and dental visits.  Flu vaccine administered in visit

## 2021-11-15 NOTE — Telephone Encounter (Signed)
Faxed copied and put pt copy up front for the pt.

## 2021-11-15 NOTE — Assessment & Plan Note (Signed)
Ordering b12 to see if this is contributing.

## 2021-11-20 ENCOUNTER — Ambulatory Visit (INDEPENDENT_AMBULATORY_CARE_PROVIDER_SITE_OTHER): Payer: Managed Care, Other (non HMO)

## 2021-11-20 DIAGNOSIS — E538 Deficiency of other specified B group vitamins: Secondary | ICD-10-CM

## 2021-11-20 LAB — CYTOLOGY - PAP
Comment: NEGATIVE
Diagnosis: NEGATIVE
High risk HPV: NEGATIVE

## 2021-11-20 MED ORDER — CYANOCOBALAMIN 1000 MCG/ML IJ SOLN
1000.0000 ug | Freq: Once | INTRAMUSCULAR | Status: AC
  Administered 2021-11-20: 1000 ug via INTRAMUSCULAR

## 2021-11-20 NOTE — Progress Notes (Signed)
Per orders of Eugenia Pancoast, NP, injection of #1 of 3 monthly B12 injections given by Pilar Grammes, CMA. Patient tolerated injection well.  Forwarding to Allie Bossier, NP in Tabitha's absence this afternoon.

## 2021-11-22 NOTE — Telephone Encounter (Addendum)
I have reached out to the patient via mychart, pt is reading the messages but not scheduling her scan. I have sent a 3rd and final message reminding her to schedule or asking her to let us know if she does not want this done at this time so that we can defer the order.   FYI

## 2021-11-22 NOTE — Telephone Encounter (Signed)
Kelly please call pt to let her know we have been trying to set her up for Ct scan and she can get this scheduled.

## 2021-11-23 NOTE — Telephone Encounter (Signed)
Left message to return call to our office.  

## 2021-11-26 ENCOUNTER — Encounter: Payer: Self-pay | Admitting: Family

## 2021-11-26 ENCOUNTER — Telehealth: Payer: Self-pay | Admitting: Family

## 2021-11-26 DIAGNOSIS — B009 Herpesviral infection, unspecified: Secondary | ICD-10-CM

## 2021-11-26 MED ORDER — VALACYCLOVIR HCL 1 G PO TABS: ORAL_TABLET | ORAL | 0 refills | Status: DC

## 2021-11-26 NOTE — Addendum Note (Signed)
Addended by: Eugenia Pancoast on: 11/26/2021 08:27 AM   Modules accepted: Orders

## 2021-11-26 NOTE — Telephone Encounter (Signed)
Noted  

## 2021-11-26 NOTE — Telephone Encounter (Signed)
Patient will also need to cancel her current appt with [facility]) once she is scheduled with [facility]

## 2021-11-26 NOTE — Telephone Encounter (Signed)
Amber from [facility] called and stated she needs her order for CT scan of neck soft tissue sent to [place]. Fax number [phone]. Call back number [phone].

## 2021-11-26 NOTE — Telephone Encounter (Signed)
Spoke with patient and she is aware that she needs to reach out to her insurance to speak with someone regarding the location of her upcoming scan.   [employer] Carleene Overlie) approved the CPT code but are waiting to speak with the patient to give final approval for the location [facility]).   Pt is going to call. She is aware that I sent the Auth number  through [place] and that before she has the scan done she needs to speak with them first.   Pt states that she chose January 2024 to have the scan bc she wants it to go toward her insurance 2024.   Will make Tabitha aware.   Nothing further needed.

## 2021-11-26 NOTE — Telephone Encounter (Signed)
Printed off and put in [place] box to be signed and then I will fax it over.

## 2021-11-27 NOTE — Telephone Encounter (Signed)
Faxed over

## 2021-11-27 NOTE — Telephone Encounter (Signed)
Signed for you please send.

## 2021-11-27 NOTE — Telephone Encounter (Signed)
In your box waiting to be signed then I can fax it over to the office.

## 2021-11-28 NOTE — Telephone Encounter (Signed)
Refaxed the orders to the new number.

## 2021-11-28 NOTE — Telephone Encounter (Signed)
Amber from [facility] called with a different fax number because they didn't receive the fax. [phone].

## 2021-11-29 NOTE — Telephone Encounter (Signed)
Left message to return call to our office.  

## 2021-12-05 ENCOUNTER — Other Ambulatory Visit: Payer: Self-pay | Admitting: Family

## 2021-12-05 DIAGNOSIS — F411 Generalized anxiety disorder: Secondary | ICD-10-CM

## 2021-12-20 ENCOUNTER — Ambulatory Visit (INDEPENDENT_AMBULATORY_CARE_PROVIDER_SITE_OTHER): Payer: Managed Care, Other (non HMO) | Admitting: Family

## 2021-12-20 ENCOUNTER — Ambulatory Visit: Payer: Managed Care, Other (non HMO) | Admitting: Family

## 2021-12-20 ENCOUNTER — Encounter: Payer: Self-pay | Admitting: Family

## 2021-12-20 VITALS — BP 118/76 | HR 75 | Temp 97.9°F | Resp 16 | Ht 64.0 in | Wt 169.0 lb

## 2021-12-20 DIAGNOSIS — G43839 Menstrual migraine, intractable, without status migrainosus: Secondary | ICD-10-CM | POA: Diagnosis not present

## 2021-12-20 DIAGNOSIS — F411 Generalized anxiety disorder: Secondary | ICD-10-CM | POA: Diagnosis not present

## 2021-12-20 DIAGNOSIS — E538 Deficiency of other specified B group vitamins: Secondary | ICD-10-CM

## 2021-12-20 DIAGNOSIS — R221 Localized swelling, mass and lump, neck: Secondary | ICD-10-CM | POA: Diagnosis not present

## 2021-12-20 MED ORDER — CYANOCOBALAMIN 1000 MCG/ML IJ SOLN
1000.0000 ug | Freq: Once | INTRAMUSCULAR | Status: AC
  Administered 2021-12-20: 1000 ug via INTRAMUSCULAR

## 2021-12-20 MED ORDER — RIZATRIPTAN BENZOATE 10 MG PO TABS: 10.0000 mg | ORAL_TABLET | ORAL | 0 refills | Status: DC | PRN

## 2021-12-20 MED ORDER — ESCITALOPRAM OXALATE 10 MG PO TABS: 10.0000 mg | ORAL_TABLET | Freq: Every day | ORAL | 1 refills | Status: DC

## 2021-12-20 NOTE — Assessment & Plan Note (Signed)
Continue with scheduled CT for January 2024

## 2021-12-20 NOTE — Assessment & Plan Note (Signed)
Refill lexapro 10 mg once daily.

## 2021-12-20 NOTE — Assessment & Plan Note (Signed)
B12 injection today in office Continue otc vitamin b12 1000 mcg once daily

## 2021-12-20 NOTE — Assessment & Plan Note (Signed)
Refilled maxalt 10 mg  stable

## 2021-12-20 NOTE — Progress Notes (Signed)
Established Patient Office Visit  Subjective:  Patient ID: Diana Whitaker, female    DOB: [birth year]  Age: 41 y.o. MRN: [id number]  CC:  Chief Complaint  Patient presents with   Anxiety    HPI Diana Whitaker is here today for follow up.   GAD: started on lexapro 10 mg once daily. Doing well with this, she feels as though she is a bit more 'mellow' . She was having increased frequency headache however states since taking it has not been that bad. Wants to continue with this dose. She is still not really sure that she wants to get set up with a therapist right now.   Vitamin b12 def: b12 injection for three months, and also also started b12 otc 1000 mcg once daily. First injection was about a month ago.   Hump back of neck, Ct scan neck scheduled for 01/23/22      11/12/2021    3:10 PM 08/23/2019   11:57 AM  GAD 7 : Generalized Anxiety Score  Nervous, Anxious, on Edge 1 2  Control/stop worrying 1 3  Worry too much - different things 1 3  Trouble relaxing 0 2  Restless 0 1  Easily annoyed or irritable 0 0  Afraid - awful might happen 2 3  Total GAD 7 Score 5 14  Anxiety Difficulty  Somewhat difficult       11/12/2021    3:09 PM 11/08/2020    8:35 AM 10/08/2019   11:58 AM  PHQ9 SCORE ONLY  PHQ-9 Total Score 2 0 0    Past Medical History:  Diagnosis Date   Migraines 1999    Past Surgical History:  Procedure Laterality Date   IRRIGATION AND DEBRIDEMENT ABSCESS N/A 09/18/2020   Procedure: IRRIGATION AND DEBRIDEMENT ABSCESS;  Surgeon: Fredirick Maudlin, MD;  Location: [facility] ORS;  Service: General;  Laterality: N/A;   LAPAROSCOPIC INGUINAL HERNIA WITH UMBILICAL HERNIA N/A 95/62/1308   with mesh   LAPAROSCOPY N/A 07/18/2020   Procedure: LAPAROSCOPY DIAGNOSTIC;  Surgeon: Olean Ree, MD;  Location: [facility] ORS;  Service: General;  Laterality: N/A;   WOUND EXPLORATION N/A 09/13/2020   Procedure: WOUND EXPLORATION;  Surgeon: Olean Ree, MD;  Location: [facility] ORS;  Service:  General;  Laterality: N/A;  Umbilical wound exploration   XI ROBOTIC ASSISTED INGUINAL HERNIA REPAIR WITH MESH Right 07/18/2020   Procedure: XI ROBOTIC ASSISTED INGUINAL HERNIA REPAIR WITH MESH;  Surgeon: Olean Ree, MD;  Location: [facility] ORS;  Service: General;  Laterality: Right;    Family History  Adopted: Yes  Problem Relation Age of Onset   Kidney disease Mother    Skin cancer Mother    Blindness Maternal Grandmother    Heart attack Maternal Grandmother    Hypertension Maternal Grandfather    Heart attack Maternal Grandfather    Other Son        Tetrasomy 9p   Breast cancer Neg Hx     Social History   Socioeconomic History   Marital status: Married    Spouse name: Not on file   Number of children: Not on file   Years of education: Not on file   Highest education level: Not on file  Occupational History   Not on file  Tobacco Use   Smoking status: Never   Smokeless tobacco: Never  Vaping Use   Vaping Use: Never used  Substance and Sexual Activity   Alcohol use: No   Drug use: No   Sexual activity: Yes  Partners: Male    Birth control/protection: Pill  Other Topics Concern   Not on file  Social History Narrative   Son with rare genetic diseaseTetrasomy 9p   Social Determinants of Health   Financial Resource Strain: Not on file  Food Insecurity: Not on file  Transportation Needs: Not on file  Physical Activity: Not on file  Stress: Not on file  Social Connections: Not on file  Intimate Partner Violence: Not on file    Outpatient Medications Prior to Visit  Medication Sig Dispense Refill   Levonorgestrel-Ethinyl Estradiol (SIMPESSE) 0.15-0.03 &0.01 MG tablet Take 1 tablet by mouth daily. 91 tablet 0   valACYclovir (VALTREX) 1000 MG tablet Take two tablets po bid for one dose prn outbreak 30 tablet 0   escitalopram (LEXAPRO) 10 MG tablet TAKE 1 TABLET BY MOUTH EVERY DAY 90 tablet 1   rizatriptan (MAXALT) 10 MG tablet Take 1 tablet (10 mg total) by mouth  as needed for migraine. 10 tablet 0   No facility-administered medications prior to visit.    No Known Allergies       Objective:    Physical Exam Constitutional:      General: She is not in acute distress.    Appearance: Normal appearance. She is normal weight. She is not ill-appearing, toxic-appearing or diaphoretic.  Cardiovascular:     Rate and Rhythm: Normal rate and regular rhythm.  Pulmonary:     Effort: Pulmonary effort is normal.  Neurological:     General: No focal deficit present.     Mental Status: She is alert and oriented to person, place, and time. Mental status is at baseline.  Psychiatric:        Mood and Affect: Mood normal.        Behavior: Behavior normal.        Thought Content: Thought content normal.        Judgment: Judgment normal.      BP 118/76   Pulse 75   Temp 97.9 F (36.6 C)   Resp 16   Ht _0  (1.626 m)   Wt 169 lb (76.7 kg)   SpO2 98%   BMI 29.01 kg/m  Wt Readings from Last 3 Encounters:  12/20/21 169 lb (76.7 kg)  11/14/21 172 lb (78 kg)  11/12/21 172 lb (78 kg)     Health Maintenance Due  Topic Date Due   COVID-19 Vaccine (4 - 2023-24 season) 09/21/2021    There are no preventive care reminders to display for this patient.  Lab Results  Component Value Date   TSH 1.57 11/12/2021   Lab Results  Component Value Date   WBC 9.7 11/12/2021   HGB 13.7 11/12/2021   HCT 41.1 11/12/2021   MCV 93.0 11/12/2021   PLT 351.0 11/12/2021   Lab Results  Component Value Date   NA 139 11/12/2021   K 3.9 11/12/2021   CO2 30 11/12/2021   GLUCOSE 99 11/12/2021   BUN 10 11/12/2021   CREATININE 0.68 11/12/2021   BILITOT 0.3 11/12/2021   ALKPHOS 70 11/12/2021   AST 16 11/12/2021   ALT 13 11/12/2021   PROT 6.9 11/12/2021   ALBUMIN 4.2 11/12/2021   CALCIUM 9.4 11/12/2021   ANIONGAP 8 09/18/2020   EGFR 107 11/08/2020   GFR 106.42 11/12/2021   Lab Results  Component Value Date   CHOL 130 11/12/2021   Lab Results   Component Value Date   HDL 36.10 (L) 11/12/2021   Lab Results  Component  Value Date   LDLCALC 72 11/12/2021   Lab Results  Component Value Date   TRIG 106.0 11/12/2021   Lab Results  Component Value Date   CHOLHDL 4 11/12/2021   Lab Results  Component Value Date   HGBA1C 5.0 10/07/2018      Assessment & Plan:   Problem List Items Addressed This Visit       Cardiovascular and Mediastinum   Intractable menstrual migraine without status migrainosus    Refilled maxalt 10 mg  stable      Relevant Medications   rizatriptan (MAXALT) 10 MG tablet   escitalopram (LEXAPRO) 10 MG tablet     Other   GAD (generalized anxiety disorder)    Refill lexapro 10 mg once daily.        Relevant Medications   escitalopram (LEXAPRO) 10 MG tablet   Palpable mass of neck - Primary    Continue with scheduled CT for January 2024      Vitamin B12 deficiency    B12 injection today in office Continue otc vitamin b12 1000 mcg once daily       Meds ordered this encounter  Medications   rizatriptan (MAXALT) 10 MG tablet    Sig: Take 1 tablet (10 mg total) by mouth as needed for migraine.    Dispense:  10 tablet    Refill:  0    Order Specific Question:   Supervising Provider    Answer:   BEDSOLE, AMY E [[id number]]   escitalopram (LEXAPRO) 10 MG tablet    Sig: Take 1 tablet (10 mg total) by mouth daily.    Dispense:  90 tablet    Refill:  1    Order Specific Question:   Supervising Provider    Answer:   Diona Browner, AMY E [[id number]]    Follow-up: Return in about 6 months (around 06/20/2022) for f/u anxiety.    Eugenia Pancoast, FNP

## 2021-12-25 ENCOUNTER — Ambulatory Visit: Payer: Managed Care, Other (non HMO)

## 2021-12-28 ENCOUNTER — Other Ambulatory Visit: Payer: Self-pay | Admitting: Family

## 2021-12-28 DIAGNOSIS — B009 Herpesviral infection, unspecified: Secondary | ICD-10-CM

## 2022-01-02 ENCOUNTER — Ambulatory Visit: Payer: Managed Care, Other (non HMO)

## 2022-01-18 NOTE — Telephone Encounter (Addendum)
Can we send this to the referrals pool? What is needed?

## 2022-01-18 NOTE — Telephone Encounter (Signed)
[facility] pre service out patient imaging called in asking for referral to be updated and a new prior authorization to be sent in for ct scan that patient is scheduled for on 01/23/22.

## 2022-01-21 ENCOUNTER — Telehealth: Payer: Self-pay | Admitting: Family

## 2022-01-21 DIAGNOSIS — G43839 Menstrual migraine, intractable, without status migrainosus: Secondary | ICD-10-CM

## 2022-01-22 ENCOUNTER — Other Ambulatory Visit: Payer: Self-pay | Admitting: Family

## 2022-01-22 DIAGNOSIS — Z1231 Encounter for screening mammogram for malignant neoplasm of breast: Secondary | ICD-10-CM

## 2022-01-22 MED ORDER — RIZATRIPTAN BENZOATE 10 MG PO TABS: 10.0000 mg | ORAL_TABLET | ORAL | 0 refills | Status: DC | PRN

## 2022-01-22 NOTE — Telephone Encounter (Signed)
I called and spoke with patient - she is not scheduled with [facility] for the CT Scan, she cancelled this appt. She was initially scheduled with [facility] on 01/23/2022. She had to change the location to [place] due to insurance.   I called [facility] and verified that they have everything on file for her appt tomorrow, they have all the auth information and she is scheduled for 01/23/2022, arrive by 3pm.   Pt is aware that everything is good to go for tomorrow with [facility].   Nothing further needed.

## 2022-01-23 ENCOUNTER — Ambulatory Visit: Admission: RE | Admit: 2022-01-23 | Payer: Managed Care, Other (non HMO) | Source: Ambulatory Visit

## 2022-01-25 ENCOUNTER — Ambulatory Visit: Payer: Managed Care, Other (non HMO)

## 2022-01-29 ENCOUNTER — Telehealth: Payer: Self-pay | Admitting: Family

## 2022-01-29 ENCOUNTER — Ambulatory Visit: Payer: Managed Care, Other (non HMO)

## 2022-01-29 ENCOUNTER — Encounter: Payer: Self-pay | Admitting: Family

## 2022-01-29 ENCOUNTER — Telehealth: Payer: Self-pay | Admitting: *Deleted

## 2022-01-29 NOTE — Telephone Encounter (Signed)
Patient notified as instructed by telephone and verbalized understanding. 

## 2022-01-29 NOTE — Telephone Encounter (Signed)
-----   Message from Eugenia Pancoast, [place] sent at 01/29/2022  7:40 AM EST ----- Regarding: CT results Can we please call and let pt know that CT of the neck was negative. No masses were seen in the neck.
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
## 2022-01-31 ENCOUNTER — Ambulatory Visit
Admission: RE | Admit: 2022-01-31 | Discharge: 2022-01-31 | Disposition: A | Discharge status: Home / Self Care | Payer: Managed Care, Other (non HMO) | Source: Ambulatory Visit | Attending: Family | Admitting: Family

## 2022-01-31 DIAGNOSIS — Z1231 Encounter for screening mammogram for malignant neoplasm of breast: Secondary | ICD-10-CM | POA: Insufficient documentation

## 2022-02-04 ENCOUNTER — Other Ambulatory Visit: Payer: Self-pay

## 2022-02-04 ENCOUNTER — Encounter: Payer: Self-pay | Admitting: Family

## 2022-02-04 NOTE — Telephone Encounter (Signed)
Patient stated that [facility] doesn't have the medication and that the medication should be sent over to  [id number] [place] [place] Thank you!

## 2022-02-04 NOTE — Telephone Encounter (Signed)
Out of medication effective today:  rizatriptan (MAXALT) 10 MG tablet   Patient does not use [facility], she uses   [id number] [place]: [phone] Fax: [phone]

## 2022-02-05 ENCOUNTER — Other Ambulatory Visit: Payer: Self-pay

## 2022-02-05 DIAGNOSIS — G43839 Menstrual migraine, intractable, without status migrainosus: Secondary | ICD-10-CM

## 2022-02-05 MED ORDER — RIZATRIPTAN BENZOATE 10 MG PO TABS: 10.0000 mg | ORAL_TABLET | ORAL | 0 refills | Status: DC | PRN

## 2022-02-05 NOTE — Addendum Note (Signed)
Addended by: Eugenia Pancoast on: 02/05/2022 07:33 AM   Modules accepted: Orders

## 2022-02-05 NOTE — Telephone Encounter (Signed)
Refill sent to correct pharmacy today.

## 2022-02-18 ENCOUNTER — Ambulatory Visit: Payer: Managed Care, Other (non HMO) | Admitting: Family

## 2022-02-18 ENCOUNTER — Encounter: Payer: Self-pay | Admitting: Family

## 2022-02-18 VITALS — BP 118/70 | HR 86 | Temp 98.7°F | Ht 64.0 in | Wt 170.4 lb

## 2022-02-18 DIAGNOSIS — F411 Generalized anxiety disorder: Secondary | ICD-10-CM | POA: Diagnosis not present

## 2022-02-18 DIAGNOSIS — E663 Overweight: Secondary | ICD-10-CM

## 2022-02-18 DIAGNOSIS — Z6829 Body mass index (BMI) 29.0-29.9, adult: Secondary | ICD-10-CM | POA: Diagnosis not present

## 2022-02-18 DIAGNOSIS — E538 Deficiency of other specified B group vitamins: Secondary | ICD-10-CM | POA: Diagnosis not present

## 2022-02-18 LAB — VITAMIN B12: Vitamin B-12: 217 pg/mL (ref 211–911)

## 2022-02-18 MED ORDER — CYANOCOBALAMIN 1000 MCG/ML IJ SOLN
1000.0000 ug | Freq: Once | INTRAMUSCULAR | Status: AC
  Administered 2022-02-18: 1000 ug via INTRAMUSCULAR

## 2022-02-18 NOTE — Assessment & Plan Note (Signed)
Pt advised to work on diet and exercise as tolerated  

## 2022-02-18 NOTE — Assessment & Plan Note (Signed)
Ordered b12 pending results B12 injection today (given after lab draw) Pt to continue otc b12

## 2022-02-18 NOTE — Patient Instructions (Signed)
  Stop by the lab prior to leaving today. I will notify you of your results once received.   Recommendations on keeping yourself healthy:  - Exercise at least 30-45 minutes a day, 3-4 days a week.  - Eat a low-fat diet with lots of fruits and vegetables, up to 7-9 servings per day.  - Seatbelts can save your life. Wear them always.  - Smoke detectors on every level of your home, check batteries every year.  - Eye Doctor - have an eye exam every 1-2 years  - Safe sex - if you may be exposed to STDs, use a condom.  - Alcohol -  If you drink, do it moderately, less than 2 drinks per day.  - [place]. Choose someone to speak for you if you are not able.  - Depression is common in our stressful world.If you're feeling down or losing interest in things you normally enjoy, please come in for a visit.  - Violence - If anyone is threatening or hurting you, please call immediately.  Due to recent changes in healthcare laws, you may see results of your imaging and/or laboratory studies on MyChart before I have had a chance to review them.  I understand that in some cases there may be results that are confusing or concerning to you. Please understand that not all results are received at the same time and often I may need to interpret multiple results in order to provide you with the best plan of care or course of treatment. Therefore, I ask that you please give me 2 business days to thoroughly review all your results before contacting my office for clarification. Should we see a critical lab result, you will be contacted sooner.   I will see you again in one year for your annual comprehensive exam unless otherwise stated and or with acute concerns.  It was a pleasure seeing you today! Please do not hesitate to reach out with any questions and or concerns.  Regards,   Eugenia Pancoast

## 2022-02-18 NOTE — Progress Notes (Signed)
   Established Patient Office Visit  Subjective:      CC:  Chief Complaint  Patient presents with   Medical Management of Chronic Issues    HPI: Diana Whitaker is a 42 y.o. female presenting on 02/18/2022 for Medical Management of Chronic Issues .   Anxiety/depression: headaches still improved with use of lexparo, tolerating well.   Headaches: rizatriptan as needed. Last episode was two days ago, ok now. Otherwise frequency has significantly decreased.   Vitamin b12 def: taking over the counter 1000 mcg and also due for last injection today.    Social history:  Relevant past medical, surgical, family and social history reviewed and updated as indicated. Interim medical history since our last visit reviewed.  Allergies and medications reviewed and updated.  DATA REVIEWED: CHART IN EPIC     ROS: Negative unless specifically indicated above in HPI.    Current Outpatient Medications:    escitalopram (LEXAPRO) 10 MG tablet, Take 1 tablet (10 mg total) by mouth daily., Disp: 90 tablet, Rfl: 1   Levonorgestrel-Ethinyl Estradiol (SIMPESSE) 0.15-0.03 &0.01 MG tablet, Take 1 tablet by mouth daily., Disp: 91 tablet, Rfl: 0   rizatriptan (MAXALT) 10 MG tablet, Take 1 tablet (10 mg total) by mouth as needed for migraine., Disp: 10 tablet, Rfl: 0   valACYclovir (VALTREX) 1000 MG tablet, TAKE TWO TABLETS BY MOUTH TWICE A DAY FOR ONE DOSE AS NEEDED FOR OUTBREAK, Disp: 90 tablet, Rfl: 1      Objective:    BP 118/70   Pulse 86   Temp 98.7 F (37.1 C) (Oral)   Ht '5\' 4"'$  (1.626 m)   Wt 170 lb 6.4 oz (77.3 kg)   SpO2 98%   BMI 29.25 kg/m   Wt Readings from Last 3 Encounters:  02/18/22 170 lb 6.4 oz (77.3 kg)  12/20/21 169 lb (76.7 kg)  11/14/21 172 lb (78 kg)    Physical Exam  Physical Exam Constitutional:      General: not in acute distress.    Appearance: Normal appearance. normal weight. is not ill-appearing, toxic-appearing or diaphoretic.  Cardiovascular:     Rate  and Rhythm: Normal rate.  Pulmonary:     Effort: Pulmonary effort is normal.  Musculoskeletal:        General: Normal range of motion.  Neurological:     General: No focal deficit present.     Mental Status: alert and oriented to person, place, and time. Mental status is at baseline.  Psychiatric:        Mood and Affect: Mood normal.        Behavior: Behavior normal.        Thought Content: Thought content normal.        Judgment: Judgment normal.        Assessment & Plan:  Vitamin B12 deficiency Assessment & Plan: Ordered b12 pending results B12 injection today (given after lab draw) Pt to continue otc b12  Orders: -     Vitamin B12 -     Cyanocobalamin  Overweight with body mass index (BMI) of 29 to 29.9 in adult Assessment & Plan: Pt advised to work on diet and exercise as tolerated    GAD (generalized anxiety disorder) Assessment & Plan: Continue lexapro 10 mg once daily       Return in about 6 months (around 08/19/2022) for f/u anxiety.  Eugenia Pancoast, MSN, APRN, FNP-C [place]

## 2022-02-18 NOTE — Assessment & Plan Note (Signed)
Continue lexapro 10 mg once daily

## 2022-03-08 ENCOUNTER — Other Ambulatory Visit: Payer: Self-pay | Admitting: Family

## 2022-03-08 DIAGNOSIS — G43839 Menstrual migraine, intractable, without status migrainosus: Secondary | ICD-10-CM

## 2022-03-09 ENCOUNTER — Other Ambulatory Visit: Payer: Self-pay | Admitting: Family

## 2022-03-09 DIAGNOSIS — G43839 Menstrual migraine, intractable, without status migrainosus: Secondary | ICD-10-CM

## 2022-03-11 MED ORDER — RIZATRIPTAN BENZOATE 10 MG PO TABS: 10.0000 mg | ORAL_TABLET | ORAL | 0 refills | Status: DC | PRN

## 2022-04-14 ENCOUNTER — Other Ambulatory Visit: Payer: Self-pay | Admitting: Family

## 2022-04-14 DIAGNOSIS — G43839 Menstrual migraine, intractable, without status migrainosus: Secondary | ICD-10-CM

## 2022-05-08 ENCOUNTER — Other Ambulatory Visit: Payer: Self-pay | Admitting: Obstetrics and Gynecology

## 2022-05-08 DIAGNOSIS — Z3041 Encounter for surveillance of contraceptive pills: Secondary | ICD-10-CM

## 2022-05-11 ENCOUNTER — Other Ambulatory Visit: Payer: Self-pay | Admitting: Family

## 2022-05-11 DIAGNOSIS — G43839 Menstrual migraine, intractable, without status migrainosus: Secondary | ICD-10-CM

## 2022-05-13 NOTE — Telephone Encounter (Signed)
Refill for rizatriptan (MAXALT) 10 MG tablet   LV- 02/18/22 [place]- Not scheduled [facility]- 04/15/22 ( 10 tabs/ no refills)

## 2022-05-18 ENCOUNTER — Encounter: Payer: Self-pay | Admitting: Emergency Medicine

## 2022-05-18 ENCOUNTER — Ambulatory Visit
Admission: EM | Admit: 2022-05-18 | Discharge: 2022-05-18 | Disposition: A | Discharge status: Home / Self Care | Payer: Managed Care, Other (non HMO) | Attending: Physician Assistant | Admitting: Physician Assistant

## 2022-05-18 DIAGNOSIS — H9201 Otalgia, right ear: Secondary | ICD-10-CM

## 2022-05-18 DIAGNOSIS — J029 Acute pharyngitis, unspecified: Secondary | ICD-10-CM

## 2022-05-18 DIAGNOSIS — Z1152 Encounter for screening for COVID-19: Secondary | ICD-10-CM | POA: Diagnosis not present

## 2022-05-18 DIAGNOSIS — J020 Streptococcal pharyngitis: Secondary | ICD-10-CM

## 2022-05-18 LAB — GROUP A STREP BY PCR: Group A Strep by PCR: DETECTED — AB

## 2022-05-18 LAB — SARS CORONAVIRUS 2 BY RT PCR: SARS Coronavirus 2 by RT PCR: NEGATIVE

## 2022-05-18 MED ORDER — PSEUDOEPH-BROMPHEN-DM 30-2-10 MG/5ML PO SYRP: 10.0000 mL | ORAL_SOLUTION | Freq: Four times a day (QID) | ORAL | 0 refills | Status: AC | PRN

## 2022-05-18 MED ORDER — AMOXICILLIN 500 MG PO CAPS: 500.0000 mg | ORAL_CAPSULE | Freq: Two times a day (BID) | ORAL | 0 refills | Status: AC

## 2022-05-18 MED ORDER — LIDOCAINE VISCOUS HCL 2 % MT SOLN: 15.0000 mL | OROMUCOSAL | 0 refills | Status: DC | PRN

## 2022-05-18 MED ORDER — IPRATROPIUM BROMIDE 0.06 % NA SOLN: 2.0000 | Freq: Four times a day (QID) | NASAL | 0 refills | Status: DC

## 2022-05-18 NOTE — ED Provider Notes (Signed)
[facility]    CSN: [id number] Arrival date & time: 05/18/22  1439      History   Chief Complaint Chief Complaint  Patient presents with   Sore Throat   Sinus Problem    HPI Diana Whitaker is a 42 y.o. female presenting for 1 week history of intermittent sinus pressure, dry cough, and nasal congestion.  She states that today she developed pain in her throat and right ear. Denies fever, SOB, n/v/d. Denies any sick contacts. Has tried OTC meds without relief. No other complaints.  HPI  Past Medical History:  Diagnosis Date   Migraines 1999    Patient Active Problem List   Diagnosis Date Noted   Overweight with body mass index (BMI) of 29 to 29.9 in adult 02/18/2022   Vitamin B12 deficiency 12/20/2021   Vitamin D deficiency 11/15/2021   GAD (generalized anxiety disorder) 11/12/2021   Cervical spine pain 08/12/2019   Brachial plexopathy 01/05/2018   Chronic constipation 08/30/2015   Intractable menstrual migraine without status migrainosus 08/30/2015    Past Surgical History:  Procedure Laterality Date   IRRIGATION AND DEBRIDEMENT ABSCESS N/A 09/18/2020   Procedure: IRRIGATION AND DEBRIDEMENT ABSCESS;  Surgeon: Duanne Guess, MD;  Location: [facility];  Service: General;  Laterality: N/A;   LAPAROSCOPIC INGUINAL HERNIA WITH UMBILICAL HERNIA N/A 07/18/2020   with mesh   LAPAROSCOPY N/A 07/18/2020   Procedure: LAPAROSCOPY DIAGNOSTIC;  Surgeon: Henrene Dodge, MD;  Location: [facility];  Service: General;  Laterality: N/A;   WOUND EXPLORATION N/A 09/13/2020   Procedure: WOUND EXPLORATION;  Surgeon: Henrene Dodge, MD;  Location: [facility];  Service: General;  Laterality: N/A;  Umbilical wound exploration   XI ROBOTIC ASSISTED INGUINAL HERNIA REPAIR WITH MESH Right 07/18/2020   Procedure: XI ROBOTIC ASSISTED INGUINAL HERNIA REPAIR WITH MESH;  Surgeon: Henrene Dodge, MD;  Location: [facility];  Service: General;  Laterality: Right;    OB History     Gravida  1    Para      Term      Preterm      AB      Living  1      SAB      IAB      Ectopic      Multiple      Live Births  1            Home Medications    Prior to Admission medications   Medication Sig Start Date End Date Taking? Authorizing Provider  amoxicillin (AMOXIL) 500 MG capsule Take 1 capsule (500 mg total) by mouth 2 (two) times daily for 10 days. 05/18/22 05/28/22 Yes Eusebio Friendly B, PA-C  brompheniramine-pseudoephedrine-DM 30-2-10 MG/5ML syrup Take 10 mLs by mouth 4 (four) times daily as needed for up to 7 days. 05/18/22 05/25/22 Yes Eusebio Friendly B, PA-C  escitalopram (LEXAPRO) 10 MG tablet Take 1 tablet (10 mg total) by mouth daily. 12/20/21  Yes Dugal, Wyatt Mage, FNP  ipratropium (ATROVENT) 0.06 % nasal spray Place 2 sprays into both nostrils 4 (four) times daily. 05/18/22  Yes Shirlee Latch, PA-C  Levonorgestrel-Ethinyl Estradiol (SIMPESSE) 0.15-0.03 &0.01 MG tablet TAKE 1 TABLET BY MOUTH EVERY DAY 05/08/22  Yes Linzie Collin, MD  lidocaine (XYLOCAINE) 2 % solution Use as directed 15 mLs in the mouth or throat every 3 (three) hours as needed for mouth pain (swish and spit). 05/18/22  Yes Eusebio Friendly B, PA-C  rizatriptan (MAXALT) 10 MG tablet TAKE 1 TABLET  BY MOUTH AS NEEDED FOR MIGRAINE. 05/13/22   Mort Sawyers, FNP  valACYclovir (VALTREX) 1000 MG tablet TAKE TWO TABLETS BY MOUTH TWICE A DAY FOR ONE DOSE AS NEEDED FOR OUTBREAK 12/29/21   Mort Sawyers, FNP    Family History Family History  Adopted: Yes  Problem Relation Age of Onset   Kidney disease Mother    Skin cancer Mother    Blindness Maternal Grandmother    Heart attack Maternal Grandmother    Hypertension Maternal Grandfather    Heart attack Maternal Grandfather    Other Son        Tetrasomy 9p   Breast cancer Neg Hx     Social History Social History   Tobacco Use   Smoking status: Never   Smokeless tobacco: Never  Vaping Use   Vaping Use: Never used  Substance Use Topics   Alcohol  use: No   Drug use: No     Allergies   Patient has no known allergies.   Review of Systems Review of Systems  Constitutional:  Negative for chills, diaphoresis, fatigue and fever.  HENT:  Positive for congestion, ear pain, rhinorrhea, sinus pressure and sore throat. Negative for sinus pain.   Respiratory:  Positive for cough. Negative for shortness of breath.   Gastrointestinal:  Negative for abdominal pain, nausea and vomiting.  Musculoskeletal:  Negative for arthralgias and myalgias.  Skin:  Negative for rash.  Neurological:  Negative for weakness and headaches.  Hematological:  Negative for adenopathy.     Physical Exam Triage Vital Signs ED Triage Vitals  Enc Vitals Group     BP      Pulse      Resp      Temp      Temp src      SpO2      Weight      Height      Head Circumference      Peak Flow      Pain Score      Pain Loc      Pain Edu?      Excl. in GC?    No data found.  Updated Vital Signs BP (!) 154/79 (BP Location: Right Arm)   Pulse 88   Temp 98.5 F (36.9 C) (Oral)   Resp 14   Ht 5\' 4"  (1.626 m)   Wt 170 lb (77.1 kg)   SpO2 98%   BMI 29.18 kg/m      Physical Exam Vitals and nursing note reviewed.  Constitutional:      General: She is not in acute distress.    Appearance: Normal appearance. She is not ill-appearing or toxic-appearing.  HENT:     Head: Normocephalic and atraumatic.     Right Ear: Ear canal and external ear normal. A middle ear effusion is present.     Left Ear: Tympanic membrane, ear canal and external ear normal.     Nose: Congestion present.     Mouth/Throat:     Mouth: Mucous membranes are moist.     Pharynx: Oropharynx is clear. Posterior oropharyngeal erythema (mild with clear PND) present.  Eyes:     General: No scleral icterus.       Right eye: No discharge.        Left eye: No discharge.     Conjunctiva/sclera: Conjunctivae normal.  Cardiovascular:     Rate and Rhythm: Normal rate and regular rhythm.      Heart sounds: Normal heart sounds.  Pulmonary:     Effort: Pulmonary effort is normal. No respiratory distress.     Breath sounds: Normal breath sounds.  Musculoskeletal:     Cervical back: Neck supple.  Skin:    General: Skin is dry.  Neurological:     General: No focal deficit present.     Mental Status: She is alert. Mental status is at baseline.     Motor: No weakness.     Gait: Gait normal.  Psychiatric:        Mood and Affect: Mood normal.        Behavior: Behavior normal.        Thought Content: Thought content normal.      UC Treatments / Results  Labs (all labs ordered are listed, but only abnormal results are displayed) Labs Reviewed  GROUP A STREP BY PCR - Abnormal; Notable for the following components:      Result Value   Group A Strep by PCR DETECTED (*)    All other components within normal limits  SARS CORONAVIRUS 2 BY RT PCR    EKG   Radiology No results found.  Procedures Procedures (including critical care time)  Medications Ordered in UC Medications - No data to display  Initial Impression / Assessment and Plan / UC Course  I have reviewed the triage vital signs and the nursing notes.  Pertinent labs & imaging results that were available during my care of the patient were reviewed by me and considered in my medical decision making (see chart for details).   42 y/o female presents for sore throat, ear pain, nasal congestion and sinus pressure.  Congestion began about a week ago.  Sore throat and ear pain started today.  No fever.  COVID and strep testing obtained. +strep. Negative COVID.  Reviewed results with patient.  Symptoms consistent with strep pharyngitis, otitis media with effusion, postnasal drainage.  Supportive care encouraged with increasing rest and fluids.  Sent Amoxicillin, Atrovent nasal spray to pharmacy as well as Bromfed-DM, viscous lidocaine.  Reviewed return and ER precautions.   Final Clinical Impressions(s) / UC  Diagnoses   Final diagnoses:  Otalgia of right ear  Sore throat  Streptococcal sore throat     Discharge Instructions      -Strep is positive. I sent antibiotics as well as cough meds, lidocaine for your throat, and nasal spray     ED Prescriptions     Medication Sig Dispense Auth. Provider   brompheniramine-pseudoephedrine-DM 30-2-10 MG/5ML syrup Take 10 mLs by mouth 4 (four) times daily as needed for up to 7 days. 150 mL Eusebio Friendly B, PA-C   ipratropium (ATROVENT) 0.06 % nasal spray Place 2 sprays into both nostrils 4 (four) times daily. 15 mL Eusebio Friendly B, PA-C   lidocaine (XYLOCAINE) 2 % solution Use as directed 15 mLs in the mouth or throat every 3 (three) hours as needed for mouth pain (swish and spit). 100 mL Eusebio Friendly B, PA-C   amoxicillin (AMOXIL) 500 MG capsule Take 1 capsule (500 mg total) by mouth 2 (two) times daily for 10 days. 20 capsule Shirlee Latch, PA-C      PDMP not reviewed this encounter.   Shirlee Latch, PA-C 05/18/22 [phone]

## 2022-05-18 NOTE — Discharge Instructions (Addendum)
-  Strep is positive. I sent antibiotics as well as cough meds, lidocaine for your throat, and nasal spray

## 2022-05-18 NOTE — ED Triage Notes (Signed)
Patient c/o sinus congestion that started 3 days ago.  Patient reports sore throat that started yesterday.  Patient denies fevers.

## 2022-06-11 ENCOUNTER — Other Ambulatory Visit: Payer: Self-pay | Admitting: Family

## 2022-06-11 DIAGNOSIS — G43839 Menstrual migraine, intractable, without status migrainosus: Secondary | ICD-10-CM

## 2022-07-27 ENCOUNTER — Other Ambulatory Visit: Payer: Self-pay | Admitting: Family

## 2022-07-27 DIAGNOSIS — G43839 Menstrual migraine, intractable, without status migrainosus: Secondary | ICD-10-CM

## 2022-07-30 MED ORDER — RIZATRIPTAN BENZOATE 10 MG PO TABS: 10.0000 mg | ORAL_TABLET | ORAL | 0 refills | Status: DC | PRN

## 2022-09-09 ENCOUNTER — Other Ambulatory Visit: Payer: Self-pay | Admitting: Family

## 2022-09-09 DIAGNOSIS — F411 Generalized anxiety disorder: Secondary | ICD-10-CM

## 2022-10-01 ENCOUNTER — Other Ambulatory Visit: Payer: Self-pay | Admitting: Family

## 2022-10-01 DIAGNOSIS — G43839 Menstrual migraine, intractable, without status migrainosus: Secondary | ICD-10-CM

## 2022-10-22 ENCOUNTER — Other Ambulatory Visit: Payer: Self-pay | Admitting: Family

## 2022-10-22 DIAGNOSIS — B009 Herpesviral infection, unspecified: Secondary | ICD-10-CM

## 2022-11-10 ENCOUNTER — Other Ambulatory Visit: Payer: Self-pay | Admitting: Obstetrics and Gynecology

## 2022-11-10 DIAGNOSIS — Z3041 Encounter for surveillance of contraceptive pills: Secondary | ICD-10-CM

## 2022-11-14 ENCOUNTER — Encounter: Payer: Managed Care, Other (non HMO) | Admitting: Family

## 2022-11-14 ENCOUNTER — Encounter: Payer: Self-pay | Admitting: Family

## 2022-11-14 ENCOUNTER — Ambulatory Visit (INDEPENDENT_AMBULATORY_CARE_PROVIDER_SITE_OTHER): Payer: Managed Care, Other (non HMO) | Admitting: Family

## 2022-11-14 VITALS — BP 132/88 | HR 78 | Temp 98.4°F | Ht 64.0 in | Wt 178.8 lb

## 2022-11-14 DIAGNOSIS — Z13220 Encounter for screening for lipoid disorders: Secondary | ICD-10-CM

## 2022-11-14 DIAGNOSIS — N912 Amenorrhea, unspecified: Secondary | ICD-10-CM | POA: Diagnosis not present

## 2022-11-14 DIAGNOSIS — Z0000 Encounter for general adult medical examination without abnormal findings: Secondary | ICD-10-CM | POA: Diagnosis not present

## 2022-11-14 DIAGNOSIS — F411 Generalized anxiety disorder: Secondary | ICD-10-CM | POA: Diagnosis not present

## 2022-11-14 DIAGNOSIS — R221 Localized swelling, mass and lump, neck: Secondary | ICD-10-CM

## 2022-11-14 DIAGNOSIS — E538 Deficiency of other specified B group vitamins: Secondary | ICD-10-CM

## 2022-11-14 DIAGNOSIS — R229 Localized swelling, mass and lump, unspecified: Secondary | ICD-10-CM

## 2022-11-14 DIAGNOSIS — G43839 Menstrual migraine, intractable, without status migrainosus: Secondary | ICD-10-CM | POA: Diagnosis not present

## 2022-11-14 LAB — CBC
HCT: 42.8 % (ref 36.0–46.0)
Hemoglobin: 14.2 g/dL (ref 12.0–15.0)
MCHC: 33.1 g/dL (ref 30.0–36.0)
MCV: 94.5 fL (ref 78.0–100.0)
Platelets: 356 10*3/uL (ref 150.0–400.0)
RBC: 4.53 Mil/uL (ref 3.87–5.11)
RDW: 13.2 % (ref 11.5–15.5)
WBC: 12.8 10*3/uL — ABNORMAL HIGH (ref 4.0–10.5)

## 2022-11-14 LAB — BASIC METABOLIC PANEL
BUN: 11 mg/dL (ref 6–23)
CO2: 27 meq/L (ref 19–32)
Calcium: 9.4 mg/dL (ref 8.4–10.5)
Chloride: 105 meq/L (ref 96–112)
Creatinine, Ser: 0.76 mg/dL (ref 0.40–1.20)
GFR: 95.07 mL/min (ref >60.00)
Glucose, Bld: 89 mg/dL (ref 70–99)
Potassium: 4.1 meq/L (ref 3.5–5.1)
Sodium: 139 meq/L (ref 135–145)

## 2022-11-14 LAB — LIPID PANEL
Cholesterol: 137 mg/dL (ref 0–200)
HDL: 40.5 mg/dL (ref >39.00)
LDL Cholesterol: 81 mg/dL (ref 0–99)
NonHDL: 96.77
Total CHOL/HDL Ratio: 3
Triglycerides: 81 mg/dL (ref 0.0–149.0)
VLDL: 16.2 mg/dL (ref 0.0–40.0)

## 2022-11-14 LAB — FOLLICLE STIMULATING HORMONE: FSH: 1.6 m[IU]/mL

## 2022-11-14 LAB — TSH: TSH: 1.3 u[IU]/mL (ref 0.35–5.50)

## 2022-11-14 LAB — VITAMIN B12: Vitamin B-12: 233 pg/mL (ref 211–911)
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
## 2022-11-14 NOTE — Assessment & Plan Note (Signed)
Lexapro 10 mg once daily  stable

## 2022-11-14 NOTE — Assessment & Plan Note (Signed)
Patient Counseling(The following topics were reviewed): ? Preventative care handout given to pt  ?Health maintenance and immunizations reviewed. Please refer to Health maintenance section. ?Pt advised on safe sex, wearing seatbelts in car, and proper nutrition ?labwork ordered today for annual ?Dental health: Discussed importance of regular tooth brushing, flossing, and dental visits. ? ? ?

## 2022-11-14 NOTE — Assessment & Plan Note (Signed)
Stable Maxalt prn

## 2022-11-14 NOTE — Patient Instructions (Signed)
  Stop by the lab prior to leaving today. I will notify you of your results once received.   Recommendations on keeping yourself healthy:  - Exercise at least 30-45 minutes a day, 3-4 days a week.  - Eat a low-fat diet with lots of fruits and vegetables, up to 7-9 servings per day.  - Seatbelts can save your life. Wear them always.  - Smoke detectors on every level of your home, check batteries every year.  - Eye Doctor - have an eye exam every 1-2 years  - Safe sex - if you may be exposed to STDs, use a condom.  - Alcohol -  If you drink, do it moderately, less than 2 drinks per day.  - Health Care Power of Attorney. Choose someone to speak for you if you are not able.  - Depression is common in our stressful world.If you're feeling down or losing interest in things you normally enjoy, please come in for a visit.  - Violence - If anyone is threatening or hurting you, please call immediately.  Due to recent changes in healthcare laws, you may see results of your imaging and/or laboratory studies on MyChart before I have had a chance to review them.  I understand that in some cases there may be results that are confusing or concerning to you. Please understand that not all results are received at the same time and often I may need to interpret multiple results in order to provide you with the best plan of care or course of treatment. Therefore, I ask that you please give me 2 business days to thoroughly review all your results before contacting my office for clarification. Should we see a critical lab result, you will be contacted sooner.   I will see you again in one year for your annual comprehensive exam unless otherwise stated and or with acute concerns.  It was a pleasure seeing you today! Please do not hesitate to reach out with any questions and or concerns.  Regards,   Yarah Fuente    

## 2022-11-15 LAB — PROLACTIN: Prolactin: 3.7 ng/mL

## 2022-11-18 ENCOUNTER — Encounter: Payer: Self-pay | Admitting: Family

## 2022-11-18 MED ORDER — DEXAMETHASONE 1 MG PO TABS: ORAL_TABLET | ORAL | 0 refills | Status: DC

## 2022-11-18 NOTE — Addendum Note (Signed)
Addended by: Mort Sawyers on: 11/18/2022 09:08 AM   Modules accepted: Level of Service

## 2022-11-26 ENCOUNTER — Encounter: Payer: Self-pay | Admitting: Family

## 2022-11-26 DIAGNOSIS — D72829 Elevated white blood cell count, unspecified: Secondary | ICD-10-CM

## 2022-11-27 ENCOUNTER — Other Ambulatory Visit (INDEPENDENT_AMBULATORY_CARE_PROVIDER_SITE_OTHER): Payer: Managed Care, Other (non HMO)

## 2022-11-27 DIAGNOSIS — G43839 Menstrual migraine, intractable, without status migrainosus: Secondary | ICD-10-CM

## 2022-11-27 DIAGNOSIS — N912 Amenorrhea, unspecified: Secondary | ICD-10-CM

## 2022-11-27 DIAGNOSIS — D72829 Elevated white blood cell count, unspecified: Secondary | ICD-10-CM

## 2022-11-27 DIAGNOSIS — R221 Localized swelling, mass and lump, neck: Secondary | ICD-10-CM

## 2022-11-27 DIAGNOSIS — R229 Localized swelling, mass and lump, unspecified: Secondary | ICD-10-CM

## 2022-11-27 LAB — CBC WITH DIFFERENTIAL/PLATELET
Basophils Absolute: 0.1 10*3/uL (ref 0.0–0.1)
Basophils Relative: 1 % (ref 0.0–3.0)
Eosinophils Absolute: 0 10*3/uL (ref 0.0–0.7)
Eosinophils Relative: 0.1 % (ref 0.0–5.0)
HCT: 43.4 % (ref 36.0–46.0)
Hemoglobin: 14.2 g/dL (ref 12.0–15.0)
Lymphocytes Relative: 25.6 % (ref 12.0–46.0)
Lymphs Abs: 1.9 10*3/uL (ref 0.7–4.0)
MCHC: 32.9 g/dL (ref 30.0–36.0)
MCV: 94.7 fL (ref 78.0–100.0)
Monocytes Absolute: 0.2 10*3/uL (ref 0.1–1.0)
Monocytes Relative: 2.5 % — ABNORMAL LOW (ref 3.0–12.0)
Neutro Abs: 5.4 10*3/uL (ref 1.4–7.7)
Neutrophils Relative %: 70.8 % (ref 43.0–77.0)
Platelets: 380 10*3/uL (ref 150.0–400.0)
RBC: 4.58 Mil/uL (ref 3.87–5.11)
RDW: 12.7 % (ref 11.5–15.5)
WBC: 7.6 10*3/uL (ref 4.0–10.5)

## 2022-11-28 LAB — CORTISOL-AM, BLOOD: Cortisol - AM: 1.5 ug/dL — ABNORMAL LOW

## 2022-11-29 ENCOUNTER — Encounter: Payer: Self-pay | Admitting: Family

## 2022-12-08 ENCOUNTER — Other Ambulatory Visit: Payer: Self-pay | Admitting: Family

## 2022-12-08 DIAGNOSIS — G43839 Menstrual migraine, intractable, without status migrainosus: Secondary | ICD-10-CM

## 2023-01-06 ENCOUNTER — Other Ambulatory Visit: Payer: Self-pay | Admitting: Family

## 2023-01-06 DIAGNOSIS — G43839 Menstrual migraine, intractable, without status migrainosus: Secondary | ICD-10-CM

## 2023-02-06 ENCOUNTER — Other Ambulatory Visit: Payer: Self-pay | Admitting: Family

## 2023-02-06 DIAGNOSIS — Z1231 Encounter for screening mammogram for malignant neoplasm of breast: Secondary | ICD-10-CM

## 2023-02-11 ENCOUNTER — Ambulatory Visit
Admission: RE | Admit: 2023-02-11 | Discharge: 2023-02-11 | Disposition: A | Discharge status: Home / Self Care | Payer: Managed Care, Other (non HMO) | Source: Ambulatory Visit | Attending: Family | Admitting: Family

## 2023-02-11 DIAGNOSIS — Z1231 Encounter for screening mammogram for malignant neoplasm of breast: Secondary | ICD-10-CM | POA: Insufficient documentation

## 2023-02-12 ENCOUNTER — Other Ambulatory Visit: Payer: Self-pay | Admitting: Family

## 2023-02-12 DIAGNOSIS — G43839 Menstrual migraine, intractable, without status migrainosus: Secondary | ICD-10-CM

## 2023-02-13 ENCOUNTER — Encounter: Payer: Self-pay | Admitting: Family

## 2023-03-21 ENCOUNTER — Other Ambulatory Visit: Payer: Self-pay | Admitting: Family

## 2023-03-21 DIAGNOSIS — F411 Generalized anxiety disorder: Secondary | ICD-10-CM

## 2023-04-18 ENCOUNTER — Encounter: Payer: Self-pay | Admitting: Family

## 2023-04-18 DIAGNOSIS — Z3041 Encounter for surveillance of contraceptive pills: Secondary | ICD-10-CM

## 2023-04-21 MED ORDER — LEVONORGEST-ETH ESTRAD 91-DAY 0.15-0.03 &0.01 MG PO TABS: 1.0000 | ORAL_TABLET | Freq: Every day | ORAL | 0 refills | Status: DC

## 2023-05-08 ENCOUNTER — Other Ambulatory Visit: Payer: Self-pay | Admitting: Family

## 2023-05-08 DIAGNOSIS — G43839 Menstrual migraine, intractable, without status migrainosus: Secondary | ICD-10-CM

## 2023-06-30 ENCOUNTER — Encounter: Payer: Self-pay | Admitting: Family

## 2023-06-30 ENCOUNTER — Other Ambulatory Visit: Payer: Self-pay | Admitting: Family

## 2023-06-30 DIAGNOSIS — G43839 Menstrual migraine, intractable, without status migrainosus: Secondary | ICD-10-CM

## 2023-06-30 MED ORDER — RIZATRIPTAN BENZOATE 10 MG PO TABS
10.0000 mg | ORAL_TABLET | ORAL | 5 refills | Status: AC | PRN
Start: 2023-06-30 — End: ?

## 2023-07-19 ENCOUNTER — Other Ambulatory Visit: Payer: Self-pay | Admitting: Family

## 2023-07-19 DIAGNOSIS — Z3041 Encounter for surveillance of contraceptive pills: Secondary | ICD-10-CM

## 2023-10-05 ENCOUNTER — Other Ambulatory Visit: Payer: Self-pay | Admitting: Family

## 2023-10-05 DIAGNOSIS — F411 Generalized anxiety disorder: Secondary | ICD-10-CM

## 2023-10-14 ENCOUNTER — Telehealth: Payer: Self-pay | Admitting: Family

## 2023-10-14 NOTE — Telephone Encounter (Signed)
 Copied from CRM #[id number]. Topic: Appointments - Scheduling Inquiry for Clinic >> Oct 14, 2023 10:41 AM Thersia BROCKS wrote: Reason for CRM: Patient called in regarding scheduling her annual physical, first soonest isnt until February patient needs a annual for this year. This dates work for her   October 27,28 late evening on 30   ----------------------------------------------------------------------- From previous Reason for Contact - Scheduling: Patient/patient representative is calling to schedule an appointment. Refer to attachments for appointment information.

## 2023-10-17 NOTE — Telephone Encounter (Signed)
 The 28th is fine. 40 min slot

## 2023-11-05 ENCOUNTER — Encounter: Payer: Self-pay | Admitting: Family

## 2023-11-05 ENCOUNTER — Other Ambulatory Visit: Payer: Self-pay | Admitting: Family

## 2023-11-05 DIAGNOSIS — Z3041 Encounter for surveillance of contraceptive pills: Secondary | ICD-10-CM

## 2023-11-18 ENCOUNTER — Encounter: Payer: Self-pay | Admitting: Family

## 2023-11-18 ENCOUNTER — Ambulatory Visit (INDEPENDENT_AMBULATORY_CARE_PROVIDER_SITE_OTHER): Admitting: Family

## 2023-11-18 VITALS — BP 110/74 | HR 75 | Temp 98.5°F | Ht 64.0 in | Wt 182.0 lb

## 2023-11-18 DIAGNOSIS — E559 Vitamin D deficiency, unspecified: Secondary | ICD-10-CM

## 2023-11-18 DIAGNOSIS — Z1231 Encounter for screening mammogram for malignant neoplasm of breast: Secondary | ICD-10-CM

## 2023-11-18 DIAGNOSIS — Z0000 Encounter for general adult medical examination without abnormal findings: Secondary | ICD-10-CM | POA: Diagnosis not present

## 2023-11-18 DIAGNOSIS — E669 Obesity, unspecified: Secondary | ICD-10-CM | POA: Diagnosis not present

## 2023-11-18 DIAGNOSIS — E538 Deficiency of other specified B group vitamins: Secondary | ICD-10-CM

## 2023-11-18 DIAGNOSIS — F411 Generalized anxiety disorder: Secondary | ICD-10-CM

## 2023-11-18 DIAGNOSIS — Z13220 Encounter for screening for lipoid disorders: Secondary | ICD-10-CM

## 2023-11-18 DIAGNOSIS — Z1211 Encounter for screening for malignant neoplasm of colon: Secondary | ICD-10-CM

## 2023-11-18 DIAGNOSIS — Z23 Encounter for immunization: Secondary | ICD-10-CM | POA: Diagnosis not present

## 2023-11-18 LAB — CBC
HCT: 39.7 % (ref 36.0–46.0)
Hemoglobin: 13.3 g/dL (ref 12.0–15.0)
MCHC: 33.6 g/dL (ref 30.0–36.0)
MCV: 91.9 fl (ref 78.0–100.0)
Platelets: 336 K/uL (ref 150.0–400.0)
RBC: 4.32 Mil/uL (ref 3.87–5.11)
RDW: 12.4 % (ref 11.5–15.5)
WBC: 8.2 K/uL (ref 4.0–10.5)

## 2023-11-18 LAB — LIPID PANEL
Cholesterol: 135 mg/dL (ref 0–200)
HDL: 37.1 mg/dL — ABNORMAL LOW (ref >39.00)
LDL Cholesterol: 71 mg/dL (ref 0–99)
NonHDL: 97.94
Total CHOL/HDL Ratio: 4
Triglycerides: 133 mg/dL (ref 0.0–149.0)
VLDL: 26.6 mg/dL (ref 0.0–40.0)

## 2023-11-18 LAB — BASIC METABOLIC PANEL WITH GFR
BUN: 8 mg/dL (ref 6–23)
CO2: 29 meq/L (ref 19–32)
Calcium: 9.4 mg/dL (ref 8.4–10.5)
Chloride: 102 meq/L (ref 96–112)
Creatinine, Ser: 0.69 mg/dL (ref 0.40–1.20)
GFR: 104.55 mL/min (ref >60.00)
Glucose, Bld: 109 mg/dL — ABNORMAL HIGH (ref 70–99)
Potassium: 3.8 meq/L (ref 3.5–5.1)
Sodium: 139 meq/L (ref 135–145)

## 2023-11-18 LAB — TSH: TSH: 1.82 u[IU]/mL (ref 0.35–5.50)

## 2023-11-18 LAB — VITAMIN B12: Vitamin B-12: 214 pg/mL (ref 211–911)

## 2023-11-18 NOTE — Patient Instructions (Signed)
  I have sent an electronic order over to your preferred location for the following:   []   2D Mammogram  [x]   3D Mammogram  []   Bone Density   Please give this center a call to get scheduled at your convenience.  [x]   [facility]  Make sure to wear two piece  clothing  No lotions powders or deodorants the day of the appointment Make sure to bring picture ID and insurance card.  Bring list of medications you are currently taking including any supplements.

## 2023-11-18 NOTE — Progress Notes (Signed)
 Subjective:  Patient ID: Margery JONETTA Hoit, female    DOB: April 19, 1980  Age: 43 y.o. MRN: [id number]  Patient Care Team: Corwin Antu, FNP as PCP - General (Family Medicine) Janit Alm Agent, MD (Inactive) as Consulting Physician (Obstetrics and Gynecology)   CC:  Chief Complaint  Patient presents with   Annual Exam    HPI Asli Tokarski Woodell is a 43 y.o. female who presents today for an annual physical exam. She reports consuming a general diet. Not working on a specific exercise routine but plans to She generally feels well. She reports sleeping well. She does not have additional problems to discuss today.   Vision:Within last year Dental:Receives regular dental care STD:The patient denies history of sexually transmitted disease.  Mammogram: 02/11/23 Last pap: 11/14/21 negative every three years   Pt is without acute concerns.   Advanced Directives Patient does not have advanced directives    DEPRESSION SCREENING    11/18/2023   12:01 PM 11/14/2022    7:25 AM 02/18/2022   12:02 PM 11/12/2021    3:09 PM 11/08/2020    8:35 AM 10/08/2019   11:58 AM 08/23/2019   11:56 AM  PHQ 2/9 Scores  PHQ - 2 Score 0 0 0 0 0 0 0  PHQ- 9 Score 0 0 0 2        ROS: Negative unless specifically indicated above in HPI.    Current Outpatient Medications:    rizatriptan  (MAXALT ) 10 MG tablet, Take 1 tablet (10 mg total) by mouth as needed for migraine. May repeat in 2 hours if needed, Disp: 10 tablet, Rfl: 5   SIMPESSE 0.15-0.03 &0.01 MG tablet, TAKE 1 TABLET BY MOUTH EVERY DAY, Disp: 91 tablet, Rfl: 0   valACYclovir  (VALTREX ) 1000 MG tablet, TAKE TWO TABLETS BY MOUTH TWICE A DAY FOR ONE DOSE AS NEEDED FOR OUTBREAK, Disp: 90 tablet, Rfl: 1    Objective:    BP 110/74 (BP Location: Left Arm, Patient Position: Sitting, Cuff Size: Large)   Pulse 75   Temp 98.5 F (36.9 C) (Temporal)   Ht 5' 4 (1.626 m)   Wt 182 lb (82.6 kg)   LMP 11/10/2023 (Approximate)   SpO2 98%   BMI 31.24 kg/m    BP Readings from Last 3 Encounters:  11/18/23 110/74  11/14/22 132/88  05/18/22 (!) 154/79      Physical Exam Vitals reviewed.  Constitutional:      General: She is not in acute distress.    Appearance: Normal appearance. She is normal weight. She is not ill-appearing.  HENT:     Head: Normocephalic.     Right Ear: Tympanic membrane normal.     Left Ear: Tympanic membrane normal.     Nose: Nose normal.     Mouth/Throat:     Mouth: Mucous membranes are moist.  Eyes:     Extraocular Movements: Extraocular movements intact.     Pupils: Pupils are equal, round, and reactive to light.  Cardiovascular:     Rate and Rhythm: Normal rate and regular rhythm.  Pulmonary:     Effort: Pulmonary effort is normal.     Breath sounds: Normal breath sounds.  Abdominal:     General: Abdomen is flat. Bowel sounds are normal.     Palpations: Abdomen is soft.     Tenderness: There is no guarding or rebound.  Musculoskeletal:        General: Normal range of motion.     Cervical back: Normal range of  motion.  Skin:    General: Skin is warm.     Capillary Refill: Capillary refill takes less than 2 seconds.  Neurological:     General: No focal deficit present.     Mental Status: She is alert.  Psychiatric:        Mood and Affect: Mood normal.        Behavior: Behavior normal.        Thought Content: Thought content normal.        Judgment: Judgment normal.       Results   Wt Readings from Last 3 Encounters:  11/18/23 182 lb (82.6 kg)  11/14/22 178 lb 12.8 oz (81.1 kg)  05/18/22 170 lb (77.1 kg)        Assessment & Plan:   Assessment and Plan Assessment & Plan Generalized anxiety disorder Well-managed without Escitalopram  for nine days. She reports feeling fine and has been monitoring her symptoms. She is aware of the option to restart medication if symptoms worsen. - Continue to monitor symptoms and consider restarting Escitalopram  if symptoms worsen. - If restarting,  begin with half a tablet and increase to one tablet after a week. - Notify provider if medication is restarted.  General Health Maintenance Discussed dietary habits and the importance of a balanced diet. She often consumes ramen noodles due to convenience and time constraints. Discussed the benefits of a Mediterranean diet and incorporating more lean proteins, vegetables, and fiber. Emphasized the importance of protein intake to maintain satiety and overall health. - Consider a Mediterranean diet with more lean proteins, vegetables, and fiber. - Incorporate protein and fiber into snacks and meals. - Explore options for convenient, healthy meals such as pre-cooked chicken, rice, and vegetables. - Consider consulting a dietitian for personalized dietary advice. - Check insurance coverage for virtual dietitian services.  Recording duration: 11 minutes   Patient Counseling(The following topics were reviewed):  Preventative care handout given to pt  Health maintenance and immunizations reviewed. Please refer to Health maintenance section. Pt advised on safe sex, wearing seatbelts in car, and proper nutrition labwork ordered today for annual Dental health: Discussed importance of regular tooth brushing, flossing, and dental visits.      Follow-up: Return in about 1 year (around 11/17/2024) for f/u CPE.   Ginger Patrick, FNP

## 2023-11-19 ENCOUNTER — Ambulatory Visit: Payer: Self-pay | Admitting: Family

## 2023-11-19 DIAGNOSIS — Z1211 Encounter for screening for malignant neoplasm of colon: Secondary | ICD-10-CM

## 2023-11-20 NOTE — Telephone Encounter (Signed)
 Can we please cancel the referral to GI that was placed 10/28?  I put in a new referral with a new location 10/30 which we'll proceed with

## 2023-12-11 ENCOUNTER — Encounter: Payer: Self-pay | Admitting: Family

## 2023-12-11 ENCOUNTER — Other Ambulatory Visit: Payer: Self-pay | Admitting: Family

## 2023-12-11 DIAGNOSIS — B009 Herpesviral infection, unspecified: Secondary | ICD-10-CM

## 2023-12-12 MED ORDER — VALACYCLOVIR HCL 1 G PO TABS: ORAL_TABLET | ORAL | 1 refills | Status: AC

## 2024-01-02 ENCOUNTER — Encounter: Payer: Self-pay | Admitting: Family

## 2024-02-01 ENCOUNTER — Other Ambulatory Visit: Payer: Self-pay | Admitting: Family

## 2024-02-01 DIAGNOSIS — Z3041 Encounter for surveillance of contraceptive pills: Secondary | ICD-10-CM

## 2024-02-13 ENCOUNTER — Telehealth: Payer: Self-pay | Admitting: Family

## 2024-02-13 NOTE — Telephone Encounter (Signed)
 Copied from CRM [id number]. Topic: Referral - Status >> Feb 13, 2024 10:50 AM Adelita E wrote: Reason for CRM: Patient reached out to Dr. Sanjuan in regards to the colonoscopy referral that was placed, this practice stated that they do not have this referral and that it was sent over to [facility]. Patient prefers to have this done at Dr. Sanjuan office instead due to insurance, so questioning if a new referral could be sent.

## 2024-02-13 NOTE — Telephone Encounter (Signed)
 Spoke with pt. Advised her that the last referral made was sent to Dr. Megan office. She states that they do not have record of it. I have refaxed the referral to [phone]. Nothing further was needed.
